# Patient Record
Sex: Female | Born: 1990 | Race: White | Hispanic: No | Marital: Married | Smoking: Never smoker
Health system: Southern US, Community
[De-identification: ages and names within clinical notes are randomized; demographics above are authoritative.]

## PROBLEM LIST (undated history)

## (undated) DIAGNOSIS — F329 Major depressive disorder, single episode, unspecified: Secondary | ICD-10-CM

## (undated) DIAGNOSIS — F32A Depression, unspecified: Secondary | ICD-10-CM

## (undated) DIAGNOSIS — F419 Anxiety disorder, unspecified: Secondary | ICD-10-CM

## (undated) HISTORY — PX: OTHER SURGICAL HISTORY: SHX169

---
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
## 2011-02-16 ENCOUNTER — Encounter: Payer: Self-pay | Admitting: Cardiovascular Disease

## 2012-02-13 ENCOUNTER — Ambulatory Visit (INDEPENDENT_AMBULATORY_CARE_PROVIDER_SITE_OTHER): Payer: 59 | Admitting: Physician Assistant

## 2012-02-13 VITALS — BP 125/77 | HR 99 | Temp 98.0°F | Resp 16 | Ht 62.0 in | Wt 99.8 lb

## 2012-02-13 DIAGNOSIS — J069 Acute upper respiratory infection, unspecified: Secondary | ICD-10-CM

## 2012-02-13 DIAGNOSIS — R05 Cough: Secondary | ICD-10-CM

## 2012-02-13 MED ORDER — IPRATROPIUM BROMIDE 0.03 % NA SOLN: 2.0000 | Freq: Two times a day (BID) | NASAL | Status: DC

## 2012-02-13 MED ORDER — HYDROCOD POLST-CHLORPHEN POLST 10-8 MG/5ML PO LQCR: 5.0000 mL | Freq: Two times a day (BID) | ORAL | Status: DC | PRN

## 2012-02-13 MED ORDER — GUAIFENESIN ER 1200 MG PO TB12: 1.0000 | ORAL_TABLET | Freq: Two times a day (BID) | ORAL | Status: DC | PRN

## 2012-02-13 NOTE — Patient Instructions (Signed)
Get lots of rest and drink at least 64 ounces of water each day.  You can expect to begin to improve on Monday and Tuesday.

## 2012-02-13 NOTE — Progress Notes (Signed)
Subjective:    Patient ID: Belinda Avery, female    DOB: Apr 01, 1991, 21 y.o.   MRN: [id number]  HPI  21 y.o. year-old WF presents with 4 days of sinus pressure, congestion, drainage and non-productive cough.  Her symptoms were preceded by nausea, vomiting and diarrhea that began on 02/07/2012.  While those symptoms are much better, and the vomiting and diarrhea have resolved, she still has very little appetite.  History reviewed. No pertinent past medical history.  Prior to Admission medications   Medication Sig Start Date End Date Taking? Authorizing Provider  levonorgestrel (MIRENA) 20 MCG/24HR IUD 1 each by Intrauterine route once.   Yes Historical Provider, MD   No Known Allergies  History   Social History  . Marital Status: Single    Spouse Name: N/A    Number of Children: 0  . Years of Education: N/A   Occupational History  . student     [employer], Investment banker, corporate   Social History Main Topics  . Smoking status: Never Smoker   . Smokeless tobacco: Never Used  . Alcohol Use: No  . Drug Use: No  . 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 Cancer Other   . Thyroid disease Other   . Diabetes Other   . Coronary artery disease Other    Review of Systems  Constitutional: Positive for appetite change. Negative for fever, chills, diaphoresis, activity change and unexpected weight change.  HENT: Positive for congestion, rhinorrhea and postnasal drip. Negative for hearing loss, ear pain, nosebleeds, sore throat, facial swelling, sneezing, drooling, mouth sores, trouble swallowing, neck pain, neck stiffness, dental problem, voice change, tinnitus and ear discharge.   Eyes: Negative.   Respiratory: Positive for cough. Negative for apnea, choking, chest tightness, shortness of breath, wheezing and stridor.   Cardiovascular: Negative.   Gastrointestinal:  Positive for nausea, vomiting and diarrhea.       Mild nausea persists, other symptoms resolved  Genitourinary: Negative.   Musculoskeletal: Negative.   Skin: Negative.   Neurological: Positive for light-headedness and headaches. Negative for dizziness, seizures, syncope, speech difficulty, weakness and numbness.  Hematological: Negative.   Psychiatric/Behavioral: Negative.        Objective:   Physical Exam  Vitals reviewed. Constitutional: She is oriented to person, place, and time. Vital signs are normal. She appears well-developed and well-nourished. She is active. No distress.  HENT:  Head: Normocephalic and atraumatic.  Right Ear: Hearing, tympanic membrane, external ear and ear canal normal.  Left Ear: Hearing, tympanic membrane, external ear and ear canal normal.  Nose: Mucosal edema and rhinorrhea present.  No foreign bodies. Right sinus exhibits no maxillary sinus tenderness and no frontal sinus tenderness. Left sinus exhibits no maxillary sinus tenderness and no frontal sinus tenderness.  Mouth/Throat: Uvula is midline, oropharynx is clear and moist and mucous membranes are normal. No uvula swelling. No oropharyngeal exudate.  Eyes: Conjunctivae, EOM and lids are normal. Pupils are equal, round, and reactive to light. Right eye exhibits no discharge. Left eye exhibits no discharge. No scleral icterus.  Fundoscopic exam:      The right eye shows no arteriolar narrowing, no AV nicking, no exudate, no hemorrhage and no papilledema. The right eye shows red reflex.The right eye shows no venous pulsations.      The left eye shows no arteriolar narrowing, no AV nicking,  no exudate, no hemorrhage and no papilledema. The left eye shows red reflex.The left eye shows no venous pulsations. Neck: Trachea normal, normal range of motion and full passive range of motion without pain. Neck supple. No mass and no thyromegaly present.  Cardiovascular: Normal rate, regular rhythm and normal heart  sounds.   Pulmonary/Chest: Effort normal and breath sounds normal.  Abdominal: Soft. Bowel sounds are normal. She exhibits no distension and no mass. There is no tenderness. There is no rebound and no guarding.  Lymphadenopathy:       Head (right side): No submandibular, no tonsillar, no preauricular, no posterior auricular and no occipital adenopathy present.       Head (left side): No submandibular, no tonsillar, no preauricular and no occipital adenopathy present.    She has no cervical adenopathy.       Right: No supraclavicular adenopathy present.       Left: No supraclavicular adenopathy present.  Neurological: She is alert and oriented to person, place, and time. She has normal strength. No cranial nerve deficit or sensory deficit.  Skin: Skin is warm, dry and intact. No rash noted.  Psychiatric: She has a normal mood and affect. Her speech is normal and behavior is normal.      Assessment & Plan:   1. Acute upper respiratory infections of unspecified site  Guaifenesin (MUCINEX MAXIMUM STRENGTH) 1200 MG TB12, ipratropium (ATROVENT) 0.03 % nasal spray  2. Cough  chlorpheniramine-HYDROcodone (TUSSIONEX PENNKINETIC ER) 10-8 MG/5ML [facility]   Patient Instructions  Get lots of rest and drink at least 64 ounces of water each day.  You can expect to begin to improve on Monday and Tuesday.

## 2013-10-24 ENCOUNTER — Encounter: Payer: Self-pay | Admitting: Neurology

## 2013-10-25 ENCOUNTER — Encounter: Payer: Self-pay | Admitting: Neurology

## 2013-10-25 ENCOUNTER — Ambulatory Visit (INDEPENDENT_AMBULATORY_CARE_PROVIDER_SITE_OTHER): Payer: 59 | Admitting: Neurology

## 2013-10-25 ENCOUNTER — Encounter (INDEPENDENT_AMBULATORY_CARE_PROVIDER_SITE_OTHER): Payer: Self-pay

## 2013-10-25 VITALS — BP 104/65 | HR 69 | Ht 62.25 in | Wt 108.0 lb

## 2013-10-25 DIAGNOSIS — R202 Paresthesia of skin: Secondary | ICD-10-CM | POA: Insufficient documentation

## 2013-10-25 DIAGNOSIS — R209 Unspecified disturbances of skin sensation: Secondary | ICD-10-CM

## 2013-10-25 MED ORDER — GABAPENTIN 100 MG PO CAPS: 100.0000 mg | ORAL_CAPSULE | Freq: Three times a day (TID) | ORAL | Status: DC

## 2013-10-25 NOTE — Patient Instructions (Signed)
Overall you are doing fairly well but I do want to suggest a few things today:   Remember to drink plenty of fluid, eat healthy meals and do not skip any meals. Try to eat protein with a every meal and eat a healthy snack such as fruit or nuts in between meals. Try to keep a regular sleep-wake schedule and try to exercise daily, particularly in the form of walking, 20-30 minutes a day, if you can.   As far as your medications are concerned, I would like to suggest the following: 1)Please start taking gabapentin 100mg  three times a day  Follow up as needed. Please call us with any interim questions, concerns, problems, updates or refill requests.   My clinical assistant and will answer any of your questions and relay your messages to me and also relay most of my messages to you.   Our phone number is [phone]. We also have an after hours call service for urgent matters and there is a physician on-call for urgent questions. For any emergencies you know to call 911 or go to the nearest emergency room

## 2013-10-25 NOTE — Progress Notes (Signed)
[facility]    Provider:  Dr Hosie PoissonSumner Referring Provider: Almon Herculesoss, Kendra H., MD Primary Care Physician:  Leanor RubensteinSUN,VYVYAN Y, MD  CC:  pain  HPI:  Belinda Avery is a 23 y.o. female here as a referral from Dr. Tenny Crawoss for evaluation of radiating pain in arm and legs and behind right ear. Patient reports symptoms began after taking macrobid in 2012. Initially noted paresthesias, described as pins and needles sensation in right fingertips, up to elbow, comes and goes. Comes randomly and self resolves. No weakness. Occurs more often during the daytime. Also notes similar symptoms in her lower extremity (back of thigh) and behind right ear. 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 Right ear symptoms are accompanied by a migraine. No symptoms on the left side, no focal motor changes. No change in vision. Has tried multiple over the counter medications.   Otherwise healthy.   Review of Systems: Out of a complete 14 system review, the patient complains of only the following symptoms, and all other reviewed systems are negative. Headaches, numbness  History   Social History  . Marital Status: Single    Spouse Name: N/A    Number of Children: 0  . Years of Education: N/A   Occupational History  . student     [employer], Investment banker, [employer]olitical Science   Social History Main Topics  . Smoking status: Never Smoker   . Smokeless tobacco: Never Used  . Alcohol Use: Yes  . Drug Use: No  . Sexual Activity: Not on file   Other Topics Concern  . 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 Cancer Other   . Thyroid disease Other   . Diabetes Other   . Coronary artery disease Other     No past medical history on file.  Past Surgical  History  Procedure Laterality Date  . Nose cauterization      Current Outpatient Prescriptions  Medication Sig Dispense Refill  . levonorgestrel (MIRENA) 20 MCG/24HR IUD 1 each by Intrauterine route once.      . valACYclovir (VALTREX) 500 MG tablet Take 500 mg by mouth 2 (two) times daily.      . ValGANciclovir HCl (VALCYTE PO) Take 1 g by mouth.       No current facility-administered medications for this visit.    Allergies as of 10/25/2013  . (No Known Allergies)    Vitals: BP 104/65  Pulse 69  Ht 5' 2.25" (1.581 m)  Wt 108 lb (48.988 kg)  BMI 19.60 kg/m2 Last Weight:  Wt Readings from Last 1 Encounters:  10/25/13 108 lb (48.988 kg)   Last Height:   Ht Readings from Last 1 Encounters:  10/25/13 5' 2.25" (1.581 m)     Physical exam: Exam: Gen: NAD, conversant Eyes: anicteric sclerae, moist conjunctivae HENT: Atraumatic, oropharynx clear Neck: Trachea midline; supple,  Lungs: CTA, no wheezing, rales, rhonic                          CV: RRR, no MRG Abdomen: Soft, non-tender;  Extremities: No peripheral edema  Skin: Normal temperature, no rash,  Psych: Appropriate affect, pleasant  Neuro: MS: AA&Ox3, appropriately interactive, normal affect   ntion: WORLD backwards  Speech: fluent w/o paraphasic error  Memory: good recent and remote recall  CN: PERRL, EOMI no nystagmus, no ptosis, sensation intact to LT V1-V3 bilat, face symmetric, no weakness, hearing grossly intact, palate elevates symmetrically, shoulder shrug 5/5 bilat,  tongue protrudes midline, no fasiculations noted.  Motor: normal bulk and tone Strength: 5/5  In all extremities  Coord: rapid alternating and point-to-point (FNF, HTS) movements intact.  Reflexes: symmetrical, bilat downgoing toes  Sens: LT intact in all extremities  Gait: posture, stance, stride and arm-swing normal. Tandem gait intact. Able to walk on heels and toes. Romberg absent.   Assessment:  After physical and  neurologic examination, review of laboratory studies, imaging, neurophysiology testing and pre-existing records, assessment will be reviewed on the problem list.  Plan:  Treatment plan and additional workup will be reviewed under Problem List.  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 Unclear etiology of symptoms, could potentially be related to HSV 2 infection or a neuropathy related to macrobid usage. Unilateral nature of symptoms raise question of a central process but rest of exam/history is not consistent with this. Will start patient on trial of gabapentin 100mg  TID, titrate up as tolerated. If no improvement would consider MRI brain to rule out central process and/or EMG, lab workup. Follow up as needed.   Elspeth Cho, DO  [facility] [address] [place], [place] [address]  Phone [phone] Fax [phone]

## 2013-10-28 ENCOUNTER — Ambulatory Visit (INDEPENDENT_AMBULATORY_CARE_PROVIDER_SITE_OTHER): Payer: 59 | Admitting: Physician Assistant

## 2013-10-28 VITALS — BP 102/64 | HR 64 | Temp 99.1°F | Resp 16 | Ht 62.0 in | Wt 107.0 lb

## 2013-10-28 DIAGNOSIS — R300 Dysuria: Secondary | ICD-10-CM

## 2013-10-28 DIAGNOSIS — N390 Urinary tract infection, site not specified: Secondary | ICD-10-CM

## 2013-10-28 DIAGNOSIS — R350 Frequency of micturition: Secondary | ICD-10-CM

## 2013-10-28 LAB — POCT URINALYSIS DIPSTICK
Bilirubin, UA: NEGATIVE
Glucose, UA: NEGATIVE
Ketones, UA: NEGATIVE
Nitrite, UA: NEGATIVE
Protein, UA: NEGATIVE
Spec Grav, UA: 1.025
Urobilinogen, UA: 0.2
pH, UA: 6

## 2013-10-28 LAB — POCT UA - MICROSCOPIC ONLY
Casts, Ur, LPF, POC: NEGATIVE
Crystals, Ur, HPF, POC: NEGATIVE
Mucus, UA: NEGATIVE
Yeast, UA: NEGATIVE

## 2013-10-28 MED ORDER — CIPROFLOXACIN HCL 250 MG PO TABS: 250.0000 mg | ORAL_TABLET | Freq: Two times a day (BID) | ORAL | Status: DC

## 2013-10-28 MED ORDER — PHENAZOPYRIDINE HCL 200 MG PO TABS: 200.0000 mg | ORAL_TABLET | Freq: Three times a day (TID) | ORAL | Status: DC | PRN

## 2013-10-28 NOTE — Patient Instructions (Signed)
Urinary Tract Infection  Urinary tract infections (UTIs) can develop anywhere along your urinary tract. Your urinary tract is your body's drainage system for removing wastes and extra water. Your urinary tract includes two kidneys, two ureters, a bladder, and a urethra. Your kidneys are a pair of bean-shaped organs. Each kidney is about the size of your fist. They are located below your ribs, one on each side of your spine.  CAUSES  Infections are caused by microbes, which are microscopic organisms, including fungi, viruses, and bacteria. These organisms are so small that they can only be seen through a microscope. Bacteria are the microbes that most commonly cause UTIs.  SYMPTOMS   Symptoms of UTIs may vary by age and gender of the patient and by the location of the infection. Symptoms in young women typically include a frequent and intense urge to urinate and a painful, burning feeling in the bladder or urethra during urination. Older women and men are more likely to be tired, shaky, and weak and have muscle aches and abdominal pain. A fever may mean the infection is in your kidneys. Other symptoms of a kidney infection include pain in your back or sides below the ribs, nausea, and vomiting.  DIAGNOSIS  To diagnose a UTI, your caregiver will ask you about your symptoms. Your caregiver also will ask to provide a urine sample. The urine sample will be tested for bacteria and white blood cells. White blood cells are made by your body to help fight infection.  TREATMENT   Typically, UTIs can be treated with medication. Because most UTIs are caused by a bacterial infection, they usually can be treated with the use of antibiotics. The choice of antibiotic and length of treatment depend on your symptoms and the type of bacteria causing your infection.  HOME CARE INSTRUCTIONS   If you were prescribed antibiotics, take them exactly as your caregiver instructs you. Finish the medication even if you feel better after you  have only taken some of the medication.   Drink enough water and fluids to keep your urine clear or pale yellow.   Avoid caffeine, tea, and carbonated beverages. They tend to irritate your bladder.   Empty your bladder often. Avoid holding urine for long periods of time.   Empty your bladder before and after sexual intercourse.   After a bowel movement, women should cleanse from front to back. Use each tissue only once.  SEEK MEDICAL CARE IF:    You have back pain.   You develop a fever.   Your symptoms do not begin to resolve within 3 days.  SEEK IMMEDIATE MEDICAL CARE IF:    You have severe back pain or lower abdominal pain.   You develop chills.   You have nausea or vomiting.   You have continued burning or discomfort with urination.  MAKE SURE YOU:    Understand these instructions.   Will watch your condition.   Will get help right away if you are not doing well or get worse.  Document Released: 04/29/2005 Document Revised: 01/19/2012 Document Reviewed: 08/28/2011  Exit[employer] Patient Information 2014 [employer]

## 2013-10-29 NOTE — Progress Notes (Signed)
Patient ID: Belinda Avery MRN: [id number], DOB: 1991-06-05, 23 y.o. Date of Encounter: 10/29/2013, 7:52 AM  Primary Physician: Leanor RubensteinSUN,VYVYAN Y, MD  Chief Complaint: urinary frequency and dysuria  HPI: 23 y.o. year old female with presents with 1 day history of urinary frequency, dysuria, and suprapubic pressure. Denies flank pain, nausea, vomiting, fever, chills, vaginal discharge, or odor. Has not tried anything OTC. LNMP unknown due to IUD.  Patient is otherwise doing well without issues or complaints.  History reviewed. No pertinent past medical history.   Home Meds: Prior to Admission medications   Medication Sig Start Date End Date Taking? Authorizing Provider  gabapentin (NEURONTIN) 100 MG capsule Take 1 capsule (100 mg total) by mouth 3 (three) times daily. 10/25/13  Yes Omelia BlackwaterPeter Justin Sumner, DO  levonorgestrel (MIRENA) 20 MCG/24HR IUD 1 each by Intrauterine route once.   Yes Historical Provider, MD  valACYclovir (VALTREX) 500 MG tablet Take 500 mg by mouth 2 (two) times daily.   Yes Historical Provider, MD  ValGANciclovir HCl (VALCYTE PO) Take 1 g by mouth.   Yes Historical Provider, MD  ciprofloxacin (CIPRO) 250 MG tablet Take 1 tablet (250 mg total) by mouth 2 (two) times daily. 10/28/13   Nelva NayHeather M Kuulei Kleier, PA-C  phenazopyridine (PYRIDIUM) 200 MG tablet Take 1 tablet (200 mg total) by mouth 3 (three) times daily as needed for pain. 10/28/13   Nelva NayHeather M Jeanenne Licea, PA-C    Allergies:  Allergies  Allergen Reactions  . Macrolides And Ketolides     History   Social History  . Marital Status: Single    Spouse Name: N/A    Number of Children: 0  . Years of Education: N/A   Occupational History  . student     [employer], Investment banker, [employer]   Social History Main Topics  . Smoking status: Never Smoker   . Smokeless tobacco: Never Used  . Alcohol Use: Yes  . Drug Use: No  . Sexual Activity: Not on file   Other Topics Concern  . Not on file   Social History Narrative   Student.  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 Single    No children   Right handed   Working toward [employer]achelor's degree   1-2 cups daily     Review of Systems: Constitutional: negative for chills, fever, night sweats, weight changes, or fatigue  HEENT: negative for vision changes, hearing loss, congestion, rhinorrhea, ST, epistaxis, or sinus pressure Cardiovascular: negative for chest pain or palpitations Respiratory: negative for cough, hemoptysis, wheezing, shortness of breath. Abdominal: positive for suprapubic abdominal pain,   No nausea, vomiting, diarrhea, or constipation Genitourinary: positive for urinary frequency and dysuria. Negative for vaginal discharge, odor, or pelvic pain.  Dermatological: negative for rashes. Neurologic: negative for headache, dizziness, or syncope   Physical Exam: Blood pressure 102/64, pulse 64, temperature 99.1 F (37.3 C), resp. rate 16, height 5\' 2"  (1.575 m), weight 107 lb (48.535 kg), SpO2 100.00%., Body mass index is 19.57 kg/(m^2). General: Well developed, well nourished, in no acute distress. Head: Normocephalic, atraumatic, eyes without discharge, sclera non-icteric, nares are without discharge. External ear normal in appearance. Neck: Supple. No thyromegaly. Full ROM. No lymphadenopathy. Lungs: Clear bilaterally to auscultation without wheezes, rales, or rhonchi. Breathing is unlabored. Heart: RRR with S1 S2. No murmurs, rubs, or gallops appreciated. Abdominal: +BS x 4. No hepatosplenomegaly, rebound tenderness, or guarding. Positive suprapubic tenderness. No CVA tenderness bilaterally.  Msk:  Strength and tone normal for age. Extremities/Skin: Warm and dry.  No clubbing or cyanosis. No edema.  Neuro: Alert and oriented X 3. Moves all extremities spontaneously. Gait is normal. CNII-XII grossly in tact. Psych:  Responds to questions appropriately with a normal affect.   Labs: Results for orders placed in visit on 10/28/13  POCT UA - MICROSCOPIC  ONLY      Result Value Ref Range   WBC, Ur, HPF, POC 0-5     RBC, urine, microscopic 6-22     Bacteria, U Microscopic 1+     Mucus, UA neg     Epithelial cells, urine per micros 6-18     Crystals, Ur, HPF, POC neg     Casts, Ur, LPF, POC neg     Yeast, UA neg    POCT URINALYSIS DIPSTICK      Result Value Ref Range   Color, UA bright yellow     Clarity, UA cloudy     Glucose, UA neg     Bilirubin, UA neg     Ketones, UA neg     Spec Grav, UA 1.025     Blood, UA mod     pH, UA 6.0     Protein, UA neg     Urobilinogen, UA 0.2     Nitrite, UA neg     Leukocytes, UA small (1+)       ASSESSMENT AND PLAN:  23 y.o. year old female with urinary tract infection Urine culture sent Increase fluids Cipro 250 mg bid x 5 days Pyridium 200 mg tid as needed for symptomatic relief Follow up if symptoms worsen or fail to improve.  Grier Mitts, PA-C 10/29/2013 7:52 AM

## 2013-10-30 LAB — URINE CULTURE: Colony Count: 25000

## 2014-01-10 ENCOUNTER — Ambulatory Visit
Admission: RE | Admit: 2014-01-10 | Discharge: 2014-01-10 | Disposition: A | Discharge status: Home / Self Care | Payer: 59 | Source: Ambulatory Visit | Attending: Obstetrics and Gynecology | Admitting: Obstetrics and Gynecology

## 2014-01-10 ENCOUNTER — Other Ambulatory Visit: Payer: Self-pay | Admitting: Obstetrics and Gynecology

## 2014-01-10 DIAGNOSIS — R109 Unspecified abdominal pain: Secondary | ICD-10-CM

## 2014-04-24 ENCOUNTER — Encounter (HOSPITAL_COMMUNITY): Payer: Self-pay

## 2014-04-24 ENCOUNTER — Inpatient Hospital Stay (HOSPITAL_COMMUNITY): Payer: 59

## 2014-04-24 ENCOUNTER — Inpatient Hospital Stay (HOSPITAL_COMMUNITY)
Admission: AD | Admit: 2014-04-24 | Discharge: 2014-04-24 | Disposition: A | Discharge status: Home / Self Care | Payer: 59 | Source: Ambulatory Visit | Attending: Obstetrics and Gynecology | Admitting: Obstetrics and Gynecology

## 2014-04-24 DIAGNOSIS — N9489 Other specified conditions associated with female genital organs and menstrual cycle: Secondary | ICD-10-CM | POA: Diagnosis not present

## 2014-04-24 DIAGNOSIS — O209 Hemorrhage in early pregnancy, unspecified: Secondary | ICD-10-CM | POA: Insufficient documentation

## 2014-04-24 DIAGNOSIS — N938 Other specified abnormal uterine and vaginal bleeding: Secondary | ICD-10-CM | POA: Diagnosis present

## 2014-04-24 DIAGNOSIS — O2631 Retained intrauterine contraceptive device in pregnancy, first trimester: Secondary | ICD-10-CM

## 2014-04-24 DIAGNOSIS — Z3201 Encounter for pregnancy test, result positive: Secondary | ICD-10-CM | POA: Diagnosis not present

## 2014-04-24 DIAGNOSIS — Z975 Presence of (intrauterine) contraceptive device: Secondary | ICD-10-CM | POA: Diagnosis not present

## 2014-04-24 DIAGNOSIS — N949 Unspecified condition associated with female genital organs and menstrual cycle: Secondary | ICD-10-CM | POA: Diagnosis present

## 2014-04-24 LAB — CBC WITH DIFFERENTIAL/PLATELET
BASOS PCT: 0 % (ref 0–1)
Basophils Absolute: 0 10*3/uL (ref 0.0–0.1)
EOS ABS: 0.2 10*3/uL (ref 0.0–0.7)
EOS PCT: 2 % (ref 0–5)
HEMATOCRIT: 41.6 % (ref 36.0–46.0)
HEMOGLOBIN: 14.1 g/dL (ref 12.0–15.0)
LYMPHS ABS: 2.5 10*3/uL (ref 0.7–4.0)
Lymphocytes Relative: 27 % (ref 12–46)
MCH: 31.2 pg (ref 26.0–34.0)
MCHC: 33.9 g/dL (ref 30.0–36.0)
MCV: 92 fL (ref 78.0–100.0)
MONO ABS: 0.8 10*3/uL (ref 0.1–1.0)
MONOS PCT: 9 % (ref 3–12)
NEUTROS PCT: 62 % (ref 43–77)
Neutro Abs: 5.9 10*3/uL (ref 1.7–7.7)
Platelets: 264 10*3/uL (ref 150–400)
RBC: 4.52 MIL/uL (ref 3.87–5.11)
RDW: 12.2 % (ref 11.5–15.5)
WBC: 9.4 10*3/uL (ref 4.0–10.5)

## 2014-04-24 LAB — POCT PREGNANCY, URINE: Preg Test, Ur: NEGATIVE

## 2014-04-24 LAB — URINALYSIS, ROUTINE W REFLEX MICROSCOPIC
BILIRUBIN URINE: NEGATIVE
Glucose, UA: NEGATIVE mg/dL
Ketones, ur: NEGATIVE mg/dL
Leukocytes, UA: NEGATIVE
NITRITE: NEGATIVE
PROTEIN: NEGATIVE mg/dL
SPECIFIC GRAVITY, URINE: 1.01 (ref 1.005–1.030)
UROBILINOGEN UA: 0.2 mg/dL (ref 0.0–1.0)
pH: 6.5 (ref 5.0–8.0)

## 2014-04-24 LAB — URINE MICROSCOPIC-ADD ON

## 2014-04-24 LAB — HCG, QUANTITATIVE, PREGNANCY: HCG, BETA CHAIN, QUANT, S: 89 m[IU]/mL — AB (ref <5)

## 2014-04-24 LAB — WET PREP, GENITAL
Trich, Wet Prep: NONE SEEN
Yeast Wet Prep HPF POC: NONE SEEN

## 2014-04-24 NOTE — MAU Provider Note (Signed)
History     CSN: [id number]  Arrival date and time: 04/24/14 1941   First Provider Initiated Contact with Patient 04/24/14 2130      Chief Complaint  Patient presents with  . Possible Pregnancy  . Vaginal Bleeding  . Abdominal Pain   HPI [name] is a 23 y.o. G1 at Unknown who presents to MAU today with complaint of vaginal bleeding and lower abdominal pain. The patient states that she passed a large clot and had heavy bleeding on Friday. She has noted decrease to spotting since late Friday that continues today. She states off and on lower abdominal pain rated at 7/10 now. She states +HPT today. She had Mirena IUD placed 3 years ago. She has had some nausea without vomiting, diarrhea, or constipation. She also endorses dizziness, but denies fever, weakness, fatigue or LOC.   OB History   Grav Para Term Preterm Abortions TAB SAB Ect Mult Living                  History reviewed. No pertinent past medical history.  Past Surgical History  Procedure Laterality Date  . Nose cauterization      Family History  Problem Relation Age of Onset  . Cancer Other   . Thyroid disease Other   . Diabetes Other   . Coronary artery disease Other     History  Substance Use Topics  . Smoking status: Never Smoker   . Smokeless tobacco: Never Used  . Alcohol Use: Yes    Allergies:  Allergies  Allergen Reactions  . Macrolides And Ketolides     No prescriptions prior to admission    Review of Systems  Constitutional: Negative for fever and malaise/fatigue.  Gastrointestinal: Positive for nausea and abdominal pain. Negative for vomiting.  Genitourinary:       + vaginal bleeding  Neurological: Positive for dizziness. Negative for loss of consciousness and weakness.   Physical Exam   Blood pressure 128/74, pulse 86, temperature 99.3 F (37.4 C), temperature source Oral, resp. rate 18, height  (1.575 m), weight 109 lb (49.442 kg), last menstrual period  04/24/2014, SpO2 100.00%.  Physical Exam  Constitutional: She is oriented to person, place, and time. She appears well-developed and well-nourished. No distress.  HENT:  Head: Normocephalic.  Cardiovascular: Normal rate.   Respiratory: Effort normal.  GI: Soft. She exhibits no distension and no mass. There is tenderness (mild tenderness to palpation of the LLQ). There is no rebound and no guarding.  Genitourinary: Uterus is tender (mild). Uterus is not enlarged. Cervix exhibits no motion tenderness, no discharge and no friability. Right adnexum displays no mass and no tenderness. Left adnexum displays no mass and no tenderness. No bleeding around the vagina. No vaginal discharge found.    Neurological: She is alert and oriented to person, place, and time.  Skin: Skin is warm and dry. No erythema.  Psychiatric: She has a normal mood and affect.  Cervix: closed, thick  Results for orders placed during the hospital encounter of 04/24/14 (from the past 24 hour(s))  URINALYSIS, ROUTINE W REFLEX MICROSCOPIC     Status: Abnormal   Collection Time    04/24/14  7:54 PM      Result Value Ref Range   Color, Urine YELLOW  YELLOW   APPearance CLEAR  CLEAR   Specific Gravity, Urine 1.010  1.005 - 1.030   pH 6.5  5.0 - 8.0   Glucose, UA NEGATIVE  NEGATIVE mg/dL  Hgb urine dipstick LARGE (*) NEGATIVE   Bilirubin Urine NEGATIVE  NEGATIVE   Ketones, ur NEGATIVE  NEGATIVE mg/dL   Protein, ur NEGATIVE  NEGATIVE mg/dL   Urobilinogen, UA 0.2  0.0 - 1.0 mg/dL   Nitrite NEGATIVE  NEGATIVE   Leukocytes, UA NEGATIVE  NEGATIVE  URINE MICROSCOPIC-ADD ON     Status: Abnormal   Collection Time    04/24/14  7:54 PM      Result Value Ref Range   Squamous Epithelial / LPF FEW (*) RARE   WBC, UA 3-6  <3 WBC/hpf   RBC / HPF 11-20  <3 RBC/hpf   Bacteria, UA FEW (*) RARE  POCT PREGNANCY, URINE     Status: None   Collection Time    04/24/14  8:27 PM      Result Value Ref Range   Preg Test, Ur NEGATIVE   NEGATIVE  HCG, QUANTITATIVE, PREGNANCY     Status: Abnormal   Collection Time    04/24/14  8:57 PM      Result Value Ref Range   hCG, Beta Chain, Quant, S 89 (*) <5 mIU/mL  ABO/RH     Status: None   Collection Time    04/24/14  8:57 PM      Result Value Ref Range   ABO/RH(D) O POS    CBC WITH DIFFERENTIAL     Status: None   Collection Time    04/24/14  8:57 PM      Result Value Ref Range   WBC 9.4  4.0 - 10.5 K/uL   RBC 4.52  3.87 - 5.11 MIL/uL   Hemoglobin 14.1  12.0 - 15.0 g/dL   HCT 16.1  09.6 - 04.5 %   MCV 92.0  78.0 - 100.0 fL   MCH 31.2  26.0 - 34.0 pg   MCHC 33.9  30.0 - 36.0 g/dL   RDW 40.9  81.1 - 91.4 %   Platelets 264  150 - 400 K/uL   Neutrophils Relative % 62  43 - 77 %   Neutro Abs 5.9  1.7 - 7.7 K/uL   Lymphocytes Relative 27  12 - 46 %   Lymphs Abs 2.5  0.7 - 4.0 K/uL   Monocytes Relative 9  3 - 12 %   Monocytes Absolute 0.8  0.1 - 1.0 K/uL   Eosinophils Relative 2  0 - 5 %   Eosinophils Absolute 0.2  0.0 - 0.7 K/uL   Basophils Relative 0  0 - 1 %   Basophils Absolute 0.0  0.0 - 0.1 K/uL  WET PREP, GENITAL     Status: Abnormal   Collection Time    04/24/14  9:40 PM      Result Value Ref Range   Yeast Wet Prep HPF POC NONE SEEN  NONE SEEN   Trich, Wet Prep NONE SEEN  NONE SEEN   Clue Cells Wet Prep HPF POC FEW (*) NONE SEEN   WBC, Wet Prep HPF POC FEW (*) NONE SEEN   US Ob Comp Less 14 Wks  04/24/2014   CLINICAL DATA:  Vaginal bleeding, quantitative beta HCG of 89, questioned presence of intrauterine device  EXAM: OBSTETRIC <14 WK [place] AND TRANSVAGINAL OB US  TECHNIQUE: Both transabdominal and transvaginal ultrasound examinations were performed for complete evaluation of the gestation as well as the maternal uterus, adnexal regions, and pelvic cul-de-sac. Transvaginal technique was performed to assess early pregnancy.  COMPARISON:  None.  FINDINGS: No intrauterine gestation identified. There  is an intrauterine device seen in anticipated position. Both ovaries  are normal. There is no free fluid.  There is a complex cystic structure in the lower uterine segment, measuring 19 x 19 x 17 mm, demonstrating low level vascularity. It demonstrates multiple internal septations. This appears to arise in the midline posteriorly, the level of the junction of the endometrium and myometrium.  IMPRESSION: There is no sonographic evidence of intrauterine just patient. There is a complex cystic structure in the low uterine segment. It could represent a complex endometrial polyp or focus of endometrial carcinoma. Complications of endometritis could also be considered. A subendometrial atypical fibroid is another consideration. Consider close follow-up of quantitative beta HCG and when appropriate, consider pelvic MRI to further characterize this finding.   Electronically Signed   By: Esperanza Heir M.D.   On: 04/24/2014 22:40   US Ob Transvaginal  04/24/2014   CLINICAL DATA:  Vaginal bleeding, quantitative beta HCG of 89, questioned presence of intrauterine device  EXAM: OBSTETRIC <14 WK [place] AND TRANSVAGINAL OB US  TECHNIQUE: Both transabdominal and transvaginal ultrasound examinations were performed for complete evaluation of the gestation as well as the maternal uterus, adnexal regions, and pelvic cul-de-sac. Transvaginal technique was performed to assess early pregnancy.  COMPARISON:  None.  FINDINGS: No intrauterine gestation identified. There is an intrauterine device seen in anticipated position. Both ovaries are normal. There is no free fluid.  There is a complex cystic structure in the lower uterine segment, measuring 19 x 19 x 17 mm, demonstrating low level vascularity. It demonstrates multiple internal septations. This appears to arise in the midline posteriorly, the level of the junction of the endometrium and myometrium.  IMPRESSION: There is no sonographic evidence of intrauterine just patient. There is a complex cystic structure in the low uterine segment. It could  represent a complex endometrial polyp or focus of endometrial carcinoma. Complications of endometritis could also be considered. A subendometrial atypical fibroid is another consideration. Consider close follow-up of quantitative beta HCG and when appropriate, consider pelvic MRI to further characterize this finding.   Electronically Signed   By: Esperanza Heir M.D.   On: 04/24/2014 22:40    MAU Course  Procedures None  MDM UPT - negative UA, wet prep, GC/Chlamydia, CBC, ABO/RH, quant hCG and [place] today Discussed with Dr. Claiborne Billings. Follow-up in MAU in 48 hours and call the office for follow-up with Waynard Reeds  Assessment and Plan  A: Positive pregnancy test Vaginal bleeding in pregnancy IUD in place Complex cystic structure in the lower uterine segment  P: Discharge home Bleeding/Ectopic precautions discussed Patient to return to MAU in 48 hours for follow-up labs or sooner if symptoms worsen Patient advised to call [facility] tomorrow for a follow-up appointment  Marny Lowenstein, PA-C  04/25/2014, 12:34 AM

## 2014-04-24 NOTE — MAU Note (Signed)
Pt reports lower abd pain 4 days ago and passed a really large clot, still having some pain.continues to have vaginal spotting.

## 2014-04-24 NOTE — Discharge Instructions (Signed)
Pelvic Rest °Pelvic rest is sometimes recommended for women when:  °· The placenta is partially or completely covering the opening of the cervix (placenta previa). °· There is bleeding between the uterine wall and the amniotic sac in the first trimester (subchorionic hemorrhage). °· The cervix begins to open without labor starting (incompetent cervix, cervical insufficiency). °· The labor is too early (preterm labor). °HOME CARE INSTRUCTIONS °· Do not have sexual intercourse, stimulation, or an orgasm. °· Do not use tampons, douche, or put anything in the vagina. °· Do not lift anything over 10 pounds (4.5 kg). °· Avoid strenuous activity or straining your pelvic muscles. °SEEK MEDICAL CARE IF:  °· You have any vaginal bleeding during pregnancy. Treat this as a potential emergency. °· You have cramping pain felt low in the stomach (stronger than menstrual cramps). °· You notice vaginal discharge (watery, mucus, or bloody). °· You have a low, dull backache. °· There are regular contractions or uterine tightening. °SEEK IMMEDIATE MEDICAL CARE IF: °You have vaginal bleeding and have placenta previa.  °Document Released: 11/14/2010 Document Revised: 10/12/2011 Document Reviewed: 11/14/2010 °ExitCare® Patient Information ©2015 [employer]. This information is not intended to replace advice given to you by your health care provider. Make sure you discuss any questions you have with your health care provider. ° °

## 2014-04-25 ENCOUNTER — Encounter (HOSPITAL_COMMUNITY): Payer: Self-pay | Admitting: Medical

## 2014-04-25 LAB — ABO/RH: ABO/RH(D): O POS

## 2014-04-25 LAB — GC/CHLAMYDIA PROBE AMP
CT PROBE, AMP APTIMA: NEGATIVE
GC PROBE AMP APTIMA: NEGATIVE

## 2014-05-04 ENCOUNTER — Observation Stay (HOSPITAL_COMMUNITY)
Admission: AD | Admit: 2014-05-04 | Discharge: 2014-05-05 | Disposition: A | Discharge status: Home / Self Care | Payer: 59 | Source: Ambulatory Visit | Attending: Obstetrics | Admitting: Obstetrics

## 2014-05-04 ENCOUNTER — Encounter (HOSPITAL_COMMUNITY): Payer: Self-pay | Admitting: *Deleted

## 2014-05-04 DIAGNOSIS — O3680X0 Pregnancy with inconclusive fetal viability, not applicable or unspecified: Secondary | ICD-10-CM

## 2014-05-04 DIAGNOSIS — R109 Unspecified abdominal pain: Secondary | ICD-10-CM | POA: Insufficient documentation

## 2014-05-04 DIAGNOSIS — O26899 Other specified pregnancy related conditions, unspecified trimester: Principal | ICD-10-CM | POA: Insufficient documentation

## 2014-05-04 DIAGNOSIS — Z3A00 Weeks of gestation of pregnancy not specified: Secondary | ICD-10-CM | POA: Insufficient documentation

## 2014-05-04 LAB — CBC
HCT: 39.3 % (ref 36.0–46.0)
HEMATOCRIT: 37.5 % (ref 36.0–46.0)
Hemoglobin: 13.1 g/dL (ref 12.0–15.0)
Hemoglobin: 12.6 g/dL (ref 12.0–15.0)
MCH: 30.5 pg (ref 26.0–34.0)
MCH: 30.6 pg (ref 26.0–34.0)
MCHC: 33.3 g/dL (ref 30.0–36.0)
MCHC: 33.6 g/dL (ref 30.0–36.0)
MCV: 91.4 fL (ref 78.0–100.0)
MCV: 91 fL (ref 78.0–100.0)
Platelets: 225 10*3/uL (ref 150–400)
Platelets: 214 10*3/uL (ref 150–400)
RBC: 4.3 MIL/uL (ref 3.87–5.11)
RBC: 4.12 MIL/uL (ref 3.87–5.11)
RDW: 11.9 % (ref 11.5–15.5)
RDW: 11.9 % (ref 11.5–15.5)
WBC: 6.5 10*3/uL (ref 4.0–10.5)
WBC: 7.3 10*3/uL (ref 4.0–10.5)

## 2014-05-04 MED ORDER — ONDANSETRON HCL 4 MG/2ML IJ SOLN: 4.0000 mg | Freq: Four times a day (QID) | INTRAMUSCULAR | Status: DC | PRN

## 2014-05-04 MED ORDER — SODIUM CHLORIDE 0.9 % IJ SOLN: 3.0000 mL | INTRAMUSCULAR | Status: DC | PRN

## 2014-05-04 MED ORDER — DIPHENHYDRAMINE HCL 12.5 MG/5ML PO ELIX: 12.5000 mg | ORAL_SOLUTION | Freq: Four times a day (QID) | ORAL | Status: DC | PRN

## 2014-05-04 MED ORDER — DIPHENHYDRAMINE HCL 50 MG/ML IJ SOLN: 12.5000 mg | Freq: Four times a day (QID) | INTRAMUSCULAR | Status: DC | PRN

## 2014-05-04 MED ORDER — HYDROMORPHONE 0.3 MG/ML IV SOLN
INTRAVENOUS | Status: DC
  Administered 2014-05-04: 0.6 mg via INTRAVENOUS
  Administered 2014-05-04: 19:00:00 via INTRAVENOUS
  Administered 2014-05-05: 0.9 mg via INTRAVENOUS
  Filled 2014-05-04: qty 25

## 2014-05-04 MED ORDER — SODIUM CHLORIDE 0.9 % IJ SOLN: 3.0000 mL | Freq: Two times a day (BID) | INTRAMUSCULAR | Status: DC

## 2014-05-04 MED ORDER — NALOXONE HCL 0.4 MG/ML IJ SOLN: 0.4000 mg | INTRAMUSCULAR | Status: DC | PRN

## 2014-05-04 MED ORDER — LACTATED RINGERS IV SOLN
INTRAVENOUS | Status: DC
  Administered 2014-05-04: 23:00:00 via INTRAVENOUS

## 2014-05-04 MED ORDER — SODIUM CHLORIDE 0.9 % IJ SOLN: 9.0000 mL | INTRAMUSCULAR | Status: DC | PRN

## 2014-05-04 MED ORDER — SODIUM CHLORIDE 0.9 % IV SOLN: 250.0000 mL | INTRAVENOUS | Status: DC | PRN

## 2014-05-04 NOTE — H&P (Signed)
23 y.o. G1P0 with pregnancy of unknown location presents for observation.  Had a +HPT on 9/22 with an IUD in situ.  Ultrasound on 9/22 showed a 19 x 19 x 17 mm complex cystic structure in the lower uterine segment.  Beta hcg has risen inappropriately and follow up ultrasounds have not identified an intrauterine gestation.   On 9/28, she was given methotrexate for presumed ectopic pregnancy.     Beta hcg trend: 9/22: 89 9/24: 93.9 9/28 (day 0) 95.6 10/2(day 4) 123.2  She presented to clinic for day 4 labs with complaints of increased pelvic pain not relieved with percocet or motrin.  No bleeding.  F/u US in clinic today: no free fluid, unchanged appearance of endometrial lining. IUD remains in situ.  Currently, she reports minimal pain.   Much improved from clinic.  No nausea, vomiting or vaginal bleeding.   Has used PCA 3x since arrival 5 hrs ago.    History reviewed. No pertinent past medical history.  Past Surgical History  Procedure Laterality Date  . Nose cauterization      OB History  Gravida Para Term Preterm AB SAB TAB Ectopic Multiple Living  1             # Outcome Date GA Lbr Len/2nd Weight Sex Delivery Anes PTL Lv  1 CUR                 BP 105/64  Pulse 69  Temp(Src) 98.7 F (37.1 C) (Oral)  Resp 16  Ht 5\' 2"  (1.575 m)  Wt 48.444 kg (106 lb 12.8 oz)  BMI 19.53 kg/m2  SpO2 97%  LMP 04/24/2014   General:  NAD Lungs: no increased work of breathing Cardiac: RRR Abdomen:  + BS, soft, non-distended.  No ttp in rlq/llq/  No rebound or guarding Ex: no edema  Lab Results  Component Value Date   WBC 7.3 05/04/2014   HGB 12.6 05/04/2014   HCT 37.5 05/04/2014   MCV 91.0 05/04/2014   PLT 214 05/04/2014     A/P   23 y.o. G1P0 presents for obsesrvation in the setting of pregnancy of unknown location. Desired pregnancy.  Plan of care discussed with Dr. Tenny Crawoss, who desires observation, serial abdominal exams, serial cbc, and dilaudid PCA for pain control Hgb on admission  13.1.  Repeat cbc stable at 12.6.  Vital signs remain stable.    Will keep NPO.  Serial abdominal exams overnight.    Repeat cbc in AM.  If hgb stable and exam remains benign, d/c home.  Dx laparoscopy for unstable vital signs, peritoneal signs on abdominal exam or falling hgb.    [place], [facility] Hospital[facility]ANNA

## 2014-05-05 LAB — CBC
HEMATOCRIT: 37.6 % (ref 36.0–46.0)
Hemoglobin: 12.5 g/dL (ref 12.0–15.0)
MCH: 30.5 pg (ref 26.0–34.0)
MCHC: 33.2 g/dL (ref 30.0–36.0)
MCV: 91.7 fL (ref 78.0–100.0)
Platelets: 206 10*3/uL (ref 150–400)
RBC: 4.1 MIL/uL (ref 3.87–5.11)
RDW: 12 % (ref 11.5–15.5)
WBC: 6.3 10*3/uL (ref 4.0–10.5)

## 2014-05-05 MED ORDER — IBUPROFEN 800 MG PO TABS: 800.0000 mg | ORAL_TABLET | Freq: Three times a day (TID) | ORAL | Status: DC

## 2014-05-05 MED ORDER — OXYCODONE-ACETAMINOPHEN 5-325 MG PO TABS: 1.0000 | ORAL_TABLET | Freq: Four times a day (QID) | ORAL | Status: DC | PRN

## 2014-05-05 NOTE — Progress Notes (Signed)
Doing well this AM.  Has not used PCA since 0200.  T No nausea/vomiting/vaginal bleeding. No pain this AM.    BP 93/49  Pulse 67  Temp(Src) 98.7 F (37.1 C) (Oral)  Resp 12  Ht 5\' 2"  (1.575 m)  Wt 48.444 kg (106 lb 12.8 oz)  BMI 19.53 kg/m2  SpO2 98%  LMP 04/24/2014  Lab Results  Component Value Date   WBC 6.3 05/05/2014   HGB 12.5 05/05/2014   HCT 37.6 05/05/2014   MCV 91.7 05/05/2014   PLT 206 05/05/2014    A&P: 23yo G1 w ectopic pregnancy of unknown location, s/p methotrexate, admitted for observation for pelvic pain.  Hgb stable this AM at 12.5 She is asymptomatic and no longer requiring IV pain meds. Will d/c PCA, transition to PO and ambulate. If pain controlled, will d/c to home.

## 2014-05-05 NOTE — Discharge Summary (Signed)
Physician Discharge Summary  Patient ID: Belinda Avery MRN: [id number] DOB/AGE: 09-Apr-1991 23 y.o.  Admit date: 05/04/2014 Discharge date: 05/05/2014  Admission Diagnoses:  Pelvic pain Ectopic pregnancy of unknown location  Discharge Diagnoses:  Active Problems:   Pregnancy, location unknown   Discharged Condition: stable  Hospital Course: Pt admitted for observation with serial labs, serial abdominal exam and pain control. TVUS on day of admission did not show any free fluid in the pelvis.  Hemoglobin remained stable over three cbcs.   Minimal IV pain medication was required. On HD#2, she was transitioned to PO pain meds.  She had no pain and a benign abdominal exam. Was deemed stable for discharge to home.  Ectopic precautions given.   Consults: None   Discharge Exam: Blood pressure 93/49, pulse 67, temperature 98.7 F (37.1 C), temperature source Oral, resp. rate 12, height 5\' 2"  (1.575 m), weight 48.444 kg (106 lb 12.8 oz), last menstrual period 04/24/2014, SpO2 98.00%. General appearance: alert, cooperative and no distress GI: normal findings: bowel sounds normal and soft, non-tender no rebound, no guarding  Disposition: 01-Home or Self Care        Follow-up Information   Follow up with Almon HerculesOSS,KENDRA H., MD On 05/07/2014. (Repeat labs and visit)    Specialty:  Obstetrics and Gynecology   Contact information:   [address] [place] [id number] [phone]       Signed: Marlow BaarsCLARK, Zniya Cottone 05/05/2014, 9:35 AM

## 2014-05-05 NOTE — Discharge Instructions (Signed)
Methotrexate Treatment for an Ectopic Pregnancy °Methotrexate is a medicine that treats ectopic pregnancy by stopping the growth of the fertilized egg. It also helps your body absorb tissue from the egg. This takes between 2 weeks and 6 weeks. Most ectopic pregnancies can be successfully treated with methotrexate if they are detected early enough. °LET YOUR HEALTH CARE PROVIDER KNOW ABOUT: °· Any allergies you have. °· All medicines you are taking, including vitamins, herbs, eye drops, creams, and over-the-counter medicines. °· Medical conditions you have. °RISKS AND COMPLICATIONS °Generally, this is a safe treatment. However, as with any treatment, problems can occur. Possible problems or side effects include: °· Nausea. °· Vomiting. °· Diarrhea. °· Abdominal cramping. °· Mouth sores. °· Increased vaginal bleeding or spotting.   °· Swelling or irritation of the lining of your lungs (pneumonitis).  °· Failed treatment and continuation of the pregnancy.   °· Liver damage. °· Hair loss. °There is still a risk of the ectopic pregnancy rupturing while using the methotrexate. °BEFORE THE PROCEDURE °Before you take the medicine:  °· Liver tests, kidney tests, and a complete blood test are performed. °· Blood tests are performed to measure the pregnancy hormone levels and to determine your blood type. °· If you are Rh-negative and the father is Rh-positive or his Rh type is not known, you will be given a Rho (D) immune globulin shot. °PROCEDURE  °There are two methods that your health care provider may use to prescribe methotrexate. One method involves a single dose or injection of the medicine. Another method involves a series of doses given through several injections.  °AFTER THE PROCEDURE °· You may have some abdominal cramping, vaginal bleeding, and fatigue in the first few days after taking methotrexate. °· Blood tests will be taken for several weeks to check the pregnancy hormone levels. The blood tests are performed  until there is no more pregnancy hormone detected in the blood. °Document Released: 07/14/2001 Document Revised: 12/04/2013 Document Reviewed: 05/08/2013 °ExitCare® Patient Information ©2015 [employer]. This information is not intended to replace advice given to you by your health care provider. Make sure you discuss any questions you have with your health care provider. ° °

## 2014-05-05 NOTE — Progress Notes (Signed)
Discharge information reviewed with patient and her mother.  Both state understanding of home care, activity, medications, signs/symptoms to report to MD and return MD office visit.  Patients mother will assist with her care @ home and no home equipment needed.  Patient ambulated for discharge in stable condition without incident.

## 2014-05-10 ENCOUNTER — Encounter (HOSPITAL_COMMUNITY): Payer: Self-pay | Admitting: *Deleted

## 2014-05-10 ENCOUNTER — Inpatient Hospital Stay (HOSPITAL_COMMUNITY)
Admission: AD | Admit: 2014-05-10 | Discharge: 2014-05-10 | Disposition: A | Discharge status: Home / Self Care | Payer: 59 | Source: Ambulatory Visit | Attending: Obstetrics and Gynecology | Admitting: Obstetrics and Gynecology

## 2014-05-10 DIAGNOSIS — O001 Tubal pregnancy: Secondary | ICD-10-CM | POA: Insufficient documentation

## 2014-05-10 DIAGNOSIS — N949 Unspecified condition associated with female genital organs and menstrual cycle: Secondary | ICD-10-CM | POA: Insufficient documentation

## 2014-05-10 DIAGNOSIS — R102 Pelvic and perineal pain: Secondary | ICD-10-CM

## 2014-05-10 LAB — CBC
HCT: 37.7 % (ref 36.0–46.0)
Hemoglobin: 12.8 g/dL (ref 12.0–15.0)
MCH: 31 pg (ref 26.0–34.0)
MCHC: 34 g/dL (ref 30.0–36.0)
MCV: 91.3 fL (ref 78.0–100.0)
PLATELETS: 214 10*3/uL (ref 150–400)
RBC: 4.13 MIL/uL (ref 3.87–5.11)
RDW: 11.9 % (ref 11.5–15.5)
WBC: 8.1 10*3/uL (ref 4.0–10.5)

## 2014-05-10 LAB — HCG, QUANTITATIVE, PREGNANCY: hCG, Beta Chain, Quant, S: 52 m[IU]/mL — ABNORMAL HIGH (ref <5)

## 2014-05-10 MED ORDER — HYDROMORPHONE HCL 1 MG/ML IJ SOLN
1.0000 mg | Freq: Once | INTRAMUSCULAR | Status: AC
  Administered 2014-05-10: 1 mg via INTRAMUSCULAR
  Filled 2014-05-10: qty 1

## 2014-05-10 NOTE — Discharge Instructions (Signed)
°Ectopic Pregnancy °An ectopic pregnancy is when the fertilized egg attaches (implants) outside the uterus. Most ectopic pregnancies occur in the fallopian tube. Rarely do ectopic pregnancies occur on the ovary, intestine, pelvis, or cervix. In an ectopic pregnancy, the fertilized egg does not have the ability to develop into a normal, healthy baby.  °A ruptured ectopic pregnancy is one in which the fallopian tube gets torn or bursts and results in internal bleeding. Often there is intense abdominal pain, and sometimes, vaginal bleeding. Having an ectopic pregnancy can be life threatening. If left untreated, this dangerous condition can lead to a blood transfusion, abdominal surgery, or even death. °CAUSES  °Damage to the fallopian tubes is the suspected cause in most ectopic pregnancies.  °RISK FACTORS °Depending on your circumstances, the risk of having an ectopic pregnancy will vary. The level of risk can be divided into three categories. °High Risk °· You have gone through infertility treatment. °· You have had a previous ectopic pregnancy. °· You have had previous tubal surgery. °· You have had previous surgery to have the fallopian tubes tied (tubal ligation). °· You have tubal problems or diseases. °· You have been exposed to DES. DES is a medicine that was used until 1971 and had effects on babies whose mothers took the medicine. °· You become pregnant while using an intrauterine device (IUD) for birth control.  °Moderate Risk °· You have a history of infertility. °· You have a history of a sexually transmitted infection (STI). °· You have a history of pelvic inflammatory disease (PID). °· You have scarring from endometriosis. °· You have multiple sexual partners. °· You smoke.  °Low Risk °· You have had previous pelvic surgery. °· You use vaginal douching. °· You became sexually active before 22 years of age. °SIGNS AND SYMPTOMS  °An ectopic pregnancy should be suspected in anyone who has missed a period  and has abdominal pain or bleeding. °· You may experience normal pregnancy symptoms, such as: °¨ Nausea. °¨ Tiredness. °¨ Breast tenderness. °· Other symptoms may include: °¨ Pain with intercourse. °¨ Irregular vaginal bleeding or spotting. °¨ Cramping or pain on one side or in the lower abdomen. °¨ Fast heartbeat. °¨ Passing out while having a bowel movement. °· Symptoms of a ruptured ectopic pregnancy and internal bleeding may include: °¨ Sudden, severe pain in the abdomen and pelvis. °¨ Dizziness or fainting. °¨ Pain in the shoulder area. °DIAGNOSIS  °Tests that may be performed include: °· A pregnancy test. °· An ultrasound test. °· Testing the specific level of pregnancy hormone in the bloodstream. °· Taking a sample of uterus tissue (dilation and curettage, D&C). °· Surgery to perform a visual exam of the inside of the abdomen using a thin, lighted tube with a tiny camera on the end (laparoscope). °TREATMENT  °An injection of a medicine called methotrexate may be given. This medicine causes the pregnancy tissue to be absorbed. It is given if: °· The diagnosis is made early. °· The fallopian tube has not ruptured. °· You are considered to be a good candidate for the medicine. °Usually, pregnancy hormone blood levels are checked after methotrexate treatment. This is to be sure the medicine is effective. It may take 4-6 weeks for the pregnancy to be absorbed (though most pregnancies will be absorbed by 3 weeks). °Surgical treatment may be needed. A laparoscope may be used to remove the pregnancy tissue. If severe internal bleeding occurs, a cut (incision) may be made in the lower abdomen (laparotomy), and the ectopic   pregnancy is removed. This stops the bleeding. Part of the fallopian tube, or the whole tube, may be removed as well (salpingectomy). After surgery, pregnancy hormone tests may be done to be sure there is no pregnancy tissue left. You may receive a Rho (D) immune globulin shot if you are Rh negative  and the father is Rh positive, or if you do not know the Rh type of the father. This is to prevent problems with any future pregnancy. °SEEK IMMEDIATE MEDICAL CARE IF:  °You have any symptoms of an ectopic pregnancy. This is a medical emergency. °MAKE SURE YOU: °· Understand these instructions. °· Will watch your condition. °· Will get help right away if you are not doing well or get worse. °Document Released: 08/27/2004 Document Revised: 12/04/2013 Document Reviewed: 02/16/2013 °[employer]® Patient Information ©2015 [employer]. This information is not intended to replace advice given to you by your health care provider. Make sure you discuss any questions you have with your health care provider. ° ° °

## 2014-05-10 NOTE — MAU Note (Signed)
Pt reports she got a dose of MTX last week, had sudden onset of severe pain

## 2014-05-10 NOTE — MAU Provider Note (Signed)
History     CSN: [id number]  Arrival date and time: 05/10/14 0306   First Provider Initiated Contact with Patient 05/10/14 [phone]      Chief Complaint  Patient presents with  . Abdominal Pain   HPI This is a 23 y.o. female who presents with c/o sharp intermittent pain in pelvis. States just started tonight. Later, she tells me she has been having pain all week but it got worse tonight. States Percocet has not been helping.  Denies bleeding or other symptoms. Quants had been rising inappropriately and most recent one this week was in the low 100s.   RN note:  Pt reports she got a dose of MTX last week, had sudden onset of severe pain        Pt states that she received Methotrexate at [facility] office a week and a half ago. Pt also states that blood tests have been doe there as well.       OB History   Grav Para Term Preterm Abortions TAB SAB Ect Mult Living   1               History reviewed. No pertinent past medical history.  Past Surgical History  Procedure Laterality Date  . Nose cauterization      Family History  Problem Relation Age of Onset  . Cancer Other   . Thyroid disease Other   . Diabetes Other   . Coronary artery disease Other   . Cancer Maternal Grandmother   . Cancer Maternal Grandfather   . Cancer Paternal Grandmother     History  Substance Use Topics  . Smoking status: Never Smoker   . Smokeless tobacco: Never Used  . Alcohol Use: No     Comment: 3 glasses of wine/week    Allergies:  Allergies  Allergen Reactions  . Macrobid [Nitrofurantoin Monohyd Macro]     Burning in arms  & legs    Prescriptions prior to admission  Medication Sig Dispense Refill  . levonorgestrel (MIRENA) 20 MCG/24HR IUD 1 each by Intrauterine route once.      . metroNIDAZOLE (METROGEL) 0.75 % gel Apply 1 application topically 2 (two) times daily as needed (for irritation).       Marland Kitchen oxyCODONE-acetaminophen (PERCOCET/ROXICET) 5-325 MG per tablet Take 1  tablet by mouth every 4 (four) hours as needed for moderate pain or severe pain.      . valACYclovir (VALTREX) 1000 MG tablet Take 1,000 mg by mouth daily as needed (for outbreaks).         Review of Systems  Constitutional: Negative for fever, chills and malaise/fatigue.  Gastrointestinal: Positive for abdominal pain. Negative for nausea, vomiting, diarrhea and constipation.  Genitourinary: Negative for dysuria.  Neurological: Negative for dizziness.   Physical Exam   Blood pressure 108/61, pulse 74, temperature 99.3 F (37.4 C), temperature source Oral, resp. rate 16, height 5\' 2"  (1.575 m), weight 107 lb 2 oz (48.592 kg), last menstrual period 04/24/2014, SpO2 100.00%.  Physical Exam  Constitutional: She is oriented to person, place, and time. She appears well-developed and well-nourished. No distress.  HENT:  Head: Normocephalic.  Cardiovascular: Normal rate.   Respiratory: Effort normal.  GI: Soft. She exhibits no distension and no mass. There is tenderness (mildly tender lower abdomen). There is no rebound and no guarding.  Genitourinary:  Deferred due to recent exam and methotrexate   Musculoskeletal: Normal range of motion.  Neurological: She is alert and oriented to person, place, and time.  Skin: Skin is warm and dry.  Psychiatric: She has a normal mood and affect.    MAU Course  Procedures  MDM Results for orders placed during the hospital encounter of 05/10/14 (from the past 72 hour(s))  CBC     Status: None   Collection Time    05/10/14  3:45 AM      Result Value Ref Range   WBC 8.1  4.0 - 10.5 K/uL   RBC 4.13  3.87 - 5.11 MIL/uL   Hemoglobin 12.8  12.0 - 15.0 g/dL   HCT 16.137.7  09.636.0 - 04.546.0 %   MCV 91.3  78.0 - 100.0 fL   MCH 31.0  26.0 - 34.0 pg   MCHC 34.0  30.0 - 36.0 g/dL   RDW 40.911.9  81.111.5 - 91.415.5 %   Platelets 214  150 - 400 K/uL  HCG, QUANTITATIVE, PREGNANCY     Status: Abnormal   Collection Time    05/10/14  3:45 AM      Result Value Ref Range    hCG, Beta Chain, Quant, S 52 (*) <5 mIU/mL   Comment:              GEST. AGE      CONC.  (mIU/mL)       <=1 WEEK        5 - 50         2 WEEKS       50 - 500         3 WEEKS       100 - 10,000         4 WEEKS     1,000 - 30,000         5 WEEKS     3,500 - 115,000       6-8 WEEKS     12,000 - 270,000        12 WEEKS     15,000 - 220,000                FEMALE AND NON-PREGNANT FEMALE:         LESS THAN 5 mIU/mL     Assessment and Plan  A:  Resolving ectopic pregnancy of unknown location      Good drop in HCG level      Pelvic pain  P;  Discussed with DR Claiborne Billingsallahan      She wants pt to continue plan with office       I suggested she might try using phenergan with Percocet to potentiate effects.  She will call office tomorrow if not better. Relieved quant is going down       Followup in office  [facility] HospitalWILLIAMS,MARIE 05/10/2014, 3:48 AM

## 2014-05-10 NOTE — MAU Note (Addendum)
Pt states that [facility]. Pt also states that blood tests have been doe there as well. Methotrexate on 9/28

## 2014-05-18 ENCOUNTER — Other Ambulatory Visit: Payer: Self-pay

## 2014-05-31 ENCOUNTER — Encounter (HOSPITAL_COMMUNITY): Payer: Self-pay | Admitting: *Deleted

## 2014-06-01 ENCOUNTER — Encounter (HOSPITAL_COMMUNITY)
Admission: RE | Disposition: A | Discharge status: Home / Self Care | Payer: Self-pay | Source: Ambulatory Visit | Attending: Obstetrics and Gynecology

## 2014-06-01 ENCOUNTER — Encounter (HOSPITAL_COMMUNITY): Payer: Self-pay

## 2014-06-01 ENCOUNTER — Ambulatory Visit (HOSPITAL_COMMUNITY): Payer: 59 | Admitting: Anesthesiology

## 2014-06-01 ENCOUNTER — Ambulatory Visit (HOSPITAL_COMMUNITY)
Admission: RE | Admit: 2014-06-01 | Discharge: 2014-06-01 | Disposition: A | Discharge status: Home / Self Care | Payer: 59 | Source: Ambulatory Visit | Attending: Obstetrics and Gynecology | Admitting: Obstetrics and Gynecology

## 2014-06-01 ENCOUNTER — Encounter (HOSPITAL_COMMUNITY): Payer: 59 | Admitting: Anesthesiology

## 2014-06-01 DIAGNOSIS — T8331XA Breakdown (mechanical) of intrauterine contraceptive device, initial encounter: Secondary | ICD-10-CM

## 2014-06-01 DIAGNOSIS — Z331 Pregnant state, incidental: Secondary | ICD-10-CM

## 2014-06-01 DIAGNOSIS — O9989 Other specified diseases and conditions complicating pregnancy, childbirth and the puerperium: Secondary | ICD-10-CM

## 2014-06-01 DIAGNOSIS — Y763 Surgical instruments, materials and obstetric and gynecological devices (including sutures) associated with adverse incidents: Secondary | ICD-10-CM | POA: Insufficient documentation

## 2014-06-01 DIAGNOSIS — O008 Other ectopic pregnancy without intrauterine pregnancy: Secondary | ICD-10-CM

## 2014-06-01 DIAGNOSIS — T8339XA Other mechanical complication of intrauterine contraceptive device, initial encounter: Secondary | ICD-10-CM | POA: Insufficient documentation

## 2014-06-01 DIAGNOSIS — Z881 Allergy status to other antibiotic agents status: Secondary | ICD-10-CM | POA: Insufficient documentation

## 2014-06-01 HISTORY — PX: HYSTEROSCOPY W/D&C: SHX1775

## 2014-06-01 LAB — COMPREHENSIVE METABOLIC PANEL
ALBUMIN: 4 g/dL (ref 3.5–5.2)
ALT: 22 U/L (ref 0–35)
AST: 20 U/L (ref 0–37)
Alkaline Phosphatase: 44 U/L (ref 39–117)
Anion gap: 9 (ref 5–15)
BUN: 7 mg/dL (ref 6–23)
CALCIUM: 9.4 mg/dL (ref 8.4–10.5)
CO2: 26 meq/L (ref 19–32)
CREATININE: 0.66 mg/dL (ref 0.50–1.10)
Chloride: 104 mEq/L (ref 96–112)
GFR calc Af Amer: >90 mL/min (ref >90)
GFR calc non Af Amer: >90 mL/min (ref >90)
Glucose, Bld: 95 mg/dL (ref 70–99)
Potassium: 3.8 mEq/L (ref 3.7–5.3)
Sodium: 139 mEq/L (ref 137–147)
Total Bilirubin: 0.5 mg/dL (ref 0.3–1.2)
Total Protein: 7.4 g/dL (ref 6.0–8.3)

## 2014-06-01 LAB — CBC
HCT: 37.6 % (ref 36.0–46.0)
Hemoglobin: 12.7 g/dL (ref 12.0–15.0)
MCH: 30.7 pg (ref 26.0–34.0)
MCHC: 33.8 g/dL (ref 30.0–36.0)
MCV: 90.8 fL (ref 78.0–100.0)
PLATELETS: 270 10*3/uL (ref 150–400)
RBC: 4.14 MIL/uL (ref 3.87–5.11)
RDW: 12.8 % (ref 11.5–15.5)
WBC: 10.5 10*3/uL (ref 4.0–10.5)

## 2014-06-01 LAB — HCG, QUANTITATIVE, PREGNANCY: HCG, BETA CHAIN, QUANT, S: 137 m[IU]/mL — AB (ref <5)

## 2014-06-01 LAB — TYPE AND SCREEN
ABO/RH(D): O POS
ANTIBODY SCREEN: NEGATIVE

## 2014-06-01 SURGERY — DILATATION AND CURETTAGE /HYSTEROSCOPY
Anesthesia: General | Site: Vagina

## 2014-06-01 MED ORDER — LACTATED RINGERS IV SOLN
INTRAVENOUS | Status: DC | PRN
  Administered 2014-06-01 (×2): via INTRAVENOUS

## 2014-06-01 MED ORDER — MIDAZOLAM HCL 2 MG/2ML IJ SOLN
INTRAMUSCULAR | Status: AC
  Filled 2014-06-01: qty 2

## 2014-06-01 MED ORDER — DEXAMETHASONE SODIUM PHOSPHATE 10 MG/ML IJ SOLN
INTRAMUSCULAR | Status: AC
  Filled 2014-06-01: qty 1

## 2014-06-01 MED ORDER — DEXAMETHASONE SODIUM PHOSPHATE 10 MG/ML IJ SOLN
INTRAMUSCULAR | Status: DC | PRN
  Administered 2014-06-01: 5 mg via INTRAVENOUS

## 2014-06-01 MED ORDER — NEOSTIGMINE METHYLSULFATE 10 MG/10ML IV SOLN
INTRAVENOUS | Status: AC
  Filled 2014-06-01: qty 1

## 2014-06-01 MED ORDER — PROPOFOL 10 MG/ML IV BOLUS
INTRAVENOUS | Status: DC | PRN
Start: 2014-06-01 — End: 2014-06-01
  Administered 2014-06-01: 150 mg via INTRAVENOUS

## 2014-06-01 MED ORDER — NEOSTIGMINE METHYLSULFATE 10 MG/10ML IV SOLN
INTRAVENOUS | Status: DC | PRN
  Administered 2014-06-01: 3 mg via INTRAVENOUS

## 2014-06-01 MED ORDER — FENTANYL CITRATE 0.05 MG/ML IJ SOLN
INTRAMUSCULAR | Status: AC
  Filled 2014-06-01: qty 2

## 2014-06-01 MED ORDER — MEDROXYPROGESTERONE ACETATE 150 MG/ML IM SUSP: 150.0000 mg | INTRAMUSCULAR | Status: DC

## 2014-06-01 MED ORDER — PHENYLEPHRINE HCL 10 MG/ML IJ SOLN
INTRAMUSCULAR | Status: DC | PRN
  Administered 2014-06-01: 40 ug via INTRAVENOUS

## 2014-06-01 MED ORDER — FENTANYL CITRATE 0.05 MG/ML IJ SOLN
25.0000 ug | INTRAMUSCULAR | Status: DC | PRN
  Administered 2014-06-01: 50 ug via INTRAVENOUS

## 2014-06-01 MED ORDER — LACTATED RINGERS IV SOLN
INTRAVENOUS | Status: DC
  Administered 2014-06-01: 08:00:00 via INTRAVENOUS

## 2014-06-01 MED ORDER — MIDAZOLAM HCL 2 MG/2ML IJ SOLN
INTRAMUSCULAR | Status: DC | PRN
  Administered 2014-06-01: 2 mg via INTRAVENOUS

## 2014-06-01 MED ORDER — LIDOCAINE HCL (CARDIAC) 20 MG/ML IV SOLN
INTRAVENOUS | Status: DC | PRN
  Administered 2014-06-01: 50 mg via INTRAVENOUS

## 2014-06-01 MED ORDER — ACETAMINOPHEN 160 MG/5ML PO SOLN
ORAL | Status: AC
  Administered 2014-06-01: 960 mg via ORAL
  Filled 2014-06-01: qty 40.6

## 2014-06-01 MED ORDER — KETOROLAC TROMETHAMINE 30 MG/ML IJ SOLN
INTRAMUSCULAR | Status: DC | PRN
  Administered 2014-06-01: 30 mg via INTRAVENOUS

## 2014-06-01 MED ORDER — BUPIVACAINE HCL (PF) 0.25 % IJ SOLN
INTRAMUSCULAR | Status: AC
  Filled 2014-06-01: qty 30

## 2014-06-01 MED ORDER — MEDROXYPROGESTERONE ACETATE 150 MG/ML IM SUSP
INTRAMUSCULAR | Status: AC
  Filled 2014-06-01: qty 1

## 2014-06-01 MED ORDER — GLYCOPYRROLATE 0.2 MG/ML IJ SOLN
INTRAMUSCULAR | Status: DC | PRN
  Administered 2014-06-01: 0.1 mg via INTRAVENOUS
  Administered 2014-06-01: 0.4 mg via INTRAVENOUS

## 2014-06-01 MED ORDER — ONDANSETRON HCL 4 MG/2ML IJ SOLN
INTRAMUSCULAR | Status: DC | PRN
  Administered 2014-06-01: 4 mg via INTRAVENOUS

## 2014-06-01 MED ORDER — ACETAMINOPHEN 160 MG/5ML PO SOLN
960.0000 mg | Freq: Four times a day (QID) | ORAL | Status: DC | PRN
  Administered 2014-06-01: 960 mg via ORAL

## 2014-06-01 MED ORDER — LIDOCAINE HCL 1 % IJ SOLN
INTRAMUSCULAR | Status: DC | PRN
  Administered 2014-06-01: 10 mL

## 2014-06-01 MED ORDER — ROCURONIUM BROMIDE 100 MG/10ML IV SOLN
INTRAVENOUS | Status: DC | PRN
  Administered 2014-06-01: 25 mg via INTRAVENOUS

## 2014-06-01 MED ORDER — FENTANYL CITRATE 0.05 MG/ML IJ SOLN
INTRAMUSCULAR | Status: AC
  Filled 2014-06-01: qty 5

## 2014-06-01 MED ORDER — SCOPOLAMINE 1 MG/3DAYS TD PT72
MEDICATED_PATCH | TRANSDERMAL | Status: AC
  Administered 2014-06-01: 1.5 mg via TRANSDERMAL
  Filled 2014-06-01: qty 1

## 2014-06-01 MED ORDER — FENTANYL CITRATE 0.05 MG/ML IJ SOLN
INTRAMUSCULAR | Status: DC | PRN
  Administered 2014-06-01: 100 ug via INTRAVENOUS
  Administered 2014-06-01 (×2): 50 ug via INTRAVENOUS

## 2014-06-01 MED ORDER — OXYCODONE-ACETAMINOPHEN 5-325 MG PO TABS: 2.0000 | ORAL_TABLET | ORAL | Status: DC | PRN

## 2014-06-01 MED ORDER — MEDROXYPROGESTERONE ACETATE 150 MG/ML IM SUSP
150.0000 mg | Freq: Once | INTRAMUSCULAR | Status: AC
  Administered 2014-06-01: 150 mg via INTRAMUSCULAR

## 2014-06-01 MED ORDER — PROPOFOL 10 MG/ML IV EMUL
INTRAVENOUS | Status: AC
  Filled 2014-06-01: qty 20

## 2014-06-01 MED ORDER — GLYCOPYRROLATE 0.2 MG/ML IJ SOLN
INTRAMUSCULAR | Status: AC
  Filled 2014-06-01: qty 3

## 2014-06-01 MED ORDER — ONDANSETRON HCL 4 MG/2ML IJ SOLN
INTRAMUSCULAR | Status: AC
  Filled 2014-06-01: qty 2

## 2014-06-01 MED ORDER — SCOPOLAMINE 1 MG/3DAYS TD PT72
1.0000 | MEDICATED_PATCH | Freq: Once | TRANSDERMAL | Status: DC
  Administered 2014-06-01: 1.5 mg via TRANSDERMAL

## 2014-06-01 MED ORDER — LIDOCAINE HCL 1 % IJ SOLN
INTRAMUSCULAR | Status: AC
  Filled 2014-06-01: qty 20

## 2014-06-01 MED ORDER — LIDOCAINE HCL (CARDIAC) 20 MG/ML IV SOLN
INTRAVENOUS | Status: AC
  Filled 2014-06-01: qty 5

## 2014-06-01 MED ORDER — PHENYLEPHRINE 40 MCG/ML (10ML) SYRINGE FOR IV PUSH (FOR BLOOD PRESSURE SUPPORT)
PREFILLED_SYRINGE | INTRAVENOUS | Status: AC
  Filled 2014-06-01: qty 5

## 2014-06-01 MED ORDER — LACTATED RINGERS IV SOLN: INTRAVENOUS | Status: DC

## 2014-06-01 SURGICAL SUPPLY — 39 items
ABLATOR ENDOMETRIAL BIPOLAR (ABLATOR) IMPLANT
BAG SPEC RTRVL LRG 6X4 10 (ENDOMECHANICALS)
BLADE SURG 11 STRL SS (BLADE) ×4 IMPLANT
CANISTER SUCT 3000ML (MISCELLANEOUS) ×4 IMPLANT
CATH ROBINSON RED A/P 16FR (CATHETERS) ×4 IMPLANT
CLOTH BEACON ORANGE TIMEOUT ST (SAFETY) ×4 IMPLANT
CONT PATH 16OZ SNAP LID 3702 (MISCELLANEOUS) IMPLANT
CONTAINER PREFILL 10% NBF 60ML (FORM) ×8 IMPLANT
DECANTER SPIKE VIAL GLASS SM (MISCELLANEOUS) ×6 IMPLANT
DILATOR CANAL MILEX (MISCELLANEOUS) IMPLANT
DRSG COVADERM PLUS 2X2 (GAUZE/BANDAGES/DRESSINGS) ×8 IMPLANT
DRSG OPSITE POSTOP 3X4 (GAUZE/BANDAGES/DRESSINGS) IMPLANT
ELECT REM PT RETURN 9FT ADLT (ELECTROSURGICAL)
ELECTRODE REM PT RTRN 9FT ADLT (ELECTROSURGICAL) IMPLANT
FORCEPS CUTTING 33CM 5MM (CUTTING FORCEPS) IMPLANT
FORCEPS CUTTING 45CM 5MM (CUTTING FORCEPS) IMPLANT
GLOVE BIO SURGEON STRL SZ7 (GLOVE) ×4 IMPLANT
GOWN STRL REUS W/TWL LRG LVL3 (GOWN DISPOSABLE) ×8 IMPLANT
LIQUID BAND (GAUZE/BANDAGES/DRESSINGS) IMPLANT
NDL SPNL 22GX3.5 QUINCKE BK (NEEDLE) IMPLANT
NEEDLE HYPO 22GX1.5 SAFETY (NEEDLE) IMPLANT
NEEDLE SPNL 22GX3.5 QUINCKE BK (NEEDLE) ×4 IMPLANT
NS IRRIG 1000ML POUR BTL (IV SOLUTION) ×4 IMPLANT
PACK LAPAROSCOPY BASIN (CUSTOM PROCEDURE TRAY) ×4 IMPLANT
PACK VAGINAL MINOR WOMEN LF (CUSTOM PROCEDURE TRAY) ×4 IMPLANT
PAD OB MATERNITY 4.3X12.25 (PERSONAL CARE ITEMS) ×4 IMPLANT
PAD TRENDELENBURG OR TABLE (MISCELLANEOUS) ×4 IMPLANT
POUCH SPECIMEN RETRIEVAL 10MM (ENDOMECHANICALS) IMPLANT
PROTECTOR NERVE ULNAR (MISCELLANEOUS) ×4 IMPLANT
SET TUBING HYSTEROSCOPY 2 NDL (TUBING) IMPLANT
SUT VIC AB 3-0 PS2 18 (SUTURE) ×4
SUT VIC AB 3-0 PS2 18XBRD (SUTURE) ×2 IMPLANT
SUT VICRYL 0 UR6 27IN ABS (SUTURE) ×8 IMPLANT
TOWEL OR 17X24 6PK STRL BLUE (TOWEL DISPOSABLE) ×8 IMPLANT
TROCAR BALLN 12MMX100 BLUNT (TROCAR) ×4 IMPLANT
TROCAR OPTI TIP 5M 100M (ENDOMECHANICALS) ×8 IMPLANT
TUBE HYSTEROSCOPY W Y-CONNECT (TUBING) IMPLANT
WARMER LAPAROSCOPE (MISCELLANEOUS) ×4 IMPLANT
WATER STERILE IRR 1000ML POUR (IV SOLUTION) ×4 IMPLANT

## 2014-06-01 NOTE — Transfer of Care (Signed)
Immediate Anesthesia Transfer of Care Note  [name]  Procedure(s) Performed: Procedure(s) with comments: DILATATION AND CURETTAGE WITH FROZEN SECTION (N/A) - WITH FROZEN SECTION  Patient Location: PACU  Anesthesia Type:General  Level of Consciousness: awake, alert  and oriented  Airway & Oxygen Therapy: Patient Spontanous Breathing and Patient connected to nasal cannula oxygen  Post-op Assessment: Report given to PACU RN and Post -op Vital signs reviewed and stable  Post vital signs: Reviewed and stable  Complications: No apparent anesthesia complications

## 2014-06-01 NOTE — Op Note (Signed)
Pre-Operative Diagnosis: 1) intrauterine device failure with resultant pregnancy of unknown location 2)failed medical management of pregnancy of unknown location Postoperative Diagnosis: same Procedure: suction dilation and curettage with removal of intrauterine device Surgeon: Dr. Waynard ReedsKendra Barabara Motz Assistant:none Operative Findings: intrauterine device within the endometrial cavity. Necrotic products of conception. Chorionic villi identified on frozen section Specimen: products of conception EBL: minimal  [name] Is a 23 year old gravida 1 para 0 who presents for definitive surgical management for cervical ectopic pregnancy resulting after a IUD failure. Please see the patient's history and physical for complete details of the history. A trial of medical management with methotrexate was attempted with 2 doses of methotrexate. Initially the patient responded with a dropping quantitative hCG, however her quantitative hCG began rising again. Following a second dose of methotrexate there was no decline in the quantitative hCG. Management options were discussed with the patient. R/B/A reviewed. Given failure of medical management the decision was made to proceed with surgical management. Based on imaging studies it was felt that the pregnancy was located at the internal cervical os. However, the exact location of the pregnancy was not definitive prior to surgery. The surgical plan was for dilation and curettage with frozen section. If Chorionic villi were not identified then we would proceed with diagnostic laparoscopy. Following appropriate informed consent was taken to the operating room. The patient was appropriately identified during a time out procedure. General anesthesia was administered and the patient was placed in the dorsal lithotomy position. The patient was prepped and draped in the normal sterile fashion. A speculum was placed into the vagina, a single-tooth tenaculum was placed on the anterior lip  of the cervix, and 10 cc of 1% lidocaine was administered in a paracervical fashion. The cervix was serially dilated with Hank dilators. The intrauterine device was removed with uterine forceps.a sharp curettage was then performed with removal of copious necrotic tissue.the frozen section revealed chorionic villi consistent with products of conception. The uterus was completely evacuated until a gritty texture was noted. This completed the procedure. The single-tooth tenaculum was reviewed from the anterior lip of the cervix. Cervix was found to be hemostatic. The speculum was removed from the vagina and the patient was partially recovery room in stable condition following the procedure.

## 2014-06-01 NOTE — Anesthesia Postprocedure Evaluation (Signed)
  Anesthesia Post-op Note  [name]  Procedure(s) Performed: Procedure(s) with comments: DILATATION AND CURETTAGE WITH FROZEN SECTION, Removal of IUD (N/A) - WITH FROZEN SECTION  Patient Location: PACU  Anesthesia Type:General  Level of Consciousness: awake, alert  and oriented  Airway and Oxygen Therapy: Patient Spontanous Breathing  Post-op Pain: none  Post-op Assessment: Post-op Vital signs reviewed, Patient's Cardiovascular Status Stable, Respiratory Function Stable, Patent Airway, No signs of Nausea or vomiting and Pain level controlled  Post-op Vital Signs: Reviewed and stable  Last Vitals:  Filed Vitals:   06/01/14 1030  BP: 95/59  Pulse: 89  Temp:   Resp: 21    Complications: No apparent anesthesia complications

## 2014-06-01 NOTE — H&P (Signed)
Belinda Avery is an 23 y.o. female.surgical management of an ectopic pregnancy  23 year old G1 P0 who was initially diagnosed with a positive pregnancy test on 04/24/2014. The patient has a Mirena intrauterine device in place and presented to the emergency room with heavy vaginal bleeding. She had a positive home pregnancy test. Her quantitative hCG was 89 at this time. 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 She was followed with serial quants for a week. Her pregnancy hormone level did not rise. It was suspected that the pregnancy was a cervical ectopic given the ultrasound findings and the decision was made to proceed with methotrexate. She is now status post 2 rounds of methotrexate 50 mg per metered square. Her quantitative hCG has continued to rise most recently it was 179 on a [facility] test. Given failure of resolution of the suspected ectopic the decision was made to proceed with surgical management. Will plan for dilation and curettage with frozen section. Frozen section fails to demonstrate chorionic villi then we will proceed with laparoscopy. Risks, benefits, alternatives of the procedure were discussed with the patient at length. Informed consent was obtained and she wishes to proceed.   Patient's last menstrual period was 04/24/2014.    History reviewed. No pertinent past medical history.  Past Surgical History  Procedure Laterality Date  . Nose cauterization      Family History  Problem Relation Age of Onset  . Cancer Other   . Thyroid disease Other   . Diabetes Other   . Coronary artery disease Other   . Cancer Maternal Grandmother   . Cancer Maternal Grandfather   . Cancer Paternal Grandmother     Social History:  reports that she has never smoked. She has never used smokeless tobacco. She reports that she drinks alcohol. She reports that she does not use illicit drugs.  Allergies:  Allergies  Allergen Reactions  . Macrobid [Nitrofurantoin Monohyd Macro]      Burning in arms  & legs    Prescriptions prior to admission  Medication Sig Dispense Refill  . levonorgestrel (MIRENA) 20 MCG/24HR IUD 1 each by Intrauterine route once.      . metroNIDAZOLE (METROGEL) 0.75 % gel Apply 1 application topically 2 (two) times daily as needed (for irritation).       Marland Kitchen. oxyCODONE-acetaminophen (PERCOCET/ROXICET) 5-325 MG per tablet Take 1 tablet by mouth every 4 (four) hours as needed for moderate pain or severe pain.      . valACYclovir (VALTREX) 1000 MG tablet Take 1,000 mg by mouth daily as needed (for outbreaks).         ROS: as above  Blood pressure 106/70, pulse 84, temperature 98.4 F (36.9 C), temperature source Oral, resp. rate 16, height 5\' 2"  (1.575 m), weight 48.988 kg (108 lb), last menstrual period 04/24/2014, SpO2 99.00%, unknown if currently breastfeeding. Physical Exam  Results for orders placed during the hospital encounter of 06/01/14 (from the past 24 hour(s))  CBC     Status: None   Collection Time    06/01/14  7:20 AM      Result Value Ref Range   WBC 10.5  4.0 - 10.5 K/uL   RBC 4.14  3.87 - 5.11 MIL/uL   Hemoglobin 12.7  12.0 - 15.0 g/dL   HCT 40.937.6  81.136.0 - 91.446.0 %   MCV 90.8  78.0 - 100.0 fL   MCH 30.7  26.0 - 34.0 pg   MCHC 33.8  30.0 - 36.0 g/dL   RDW  12.8  11.5 - 15.5 %   Platelets 270  150 - 400 K/uL  COMPREHENSIVE METABOLIC PANEL     Status: None   Collection Time    06/01/14  7:20 AM      Result Value Ref Range   Sodium 139  137 - 147 mEq/L   Potassium 3.8  3.7 - 5.3 mEq/L   Chloride 104  96 - 112 mEq/L   CO2 26  19 - 32 mEq/L   Glucose, Bld 95  70 - 99 mg/dL   BUN 7  6 - 23 mg/dL   Creatinine, Ser 1.610.66  0.50 - 1.10 mg/dL   Calcium 9.4  8.4 - 09.610.5 mg/dL   Total Protein 7.4  6.0 - 8.3 g/dL   Albumin 4.0  3.5 - 5.2 g/dL   AST 20  0 - 37 U/L   ALT 22  0 - 35 U/L   Alkaline Phosphatase 44  39 - 117 U/L   Total Bilirubin 0.5  0.3 - 1.2 mg/dL   GFR calc non Af Amer >90  >90 mL/min   GFR calc Af Amer >90  >90  mL/min   Anion gap 9  5 - 15  TYPE AND SCREEN     Status: None   Collection Time    06/01/14  7:20 AM      Result Value Ref Range   ABO/RH(D) PENDING     Antibody Screen NEG     Sample Expiration 06/04/2014    HCG, QUANTITATIVE, PREGNANCY     Status: Abnormal   Collection Time    06/01/14  7:20 AM      Result Value Ref Range   hCG, Beta Chain, Quant, S 137 (*) <5 mIU/mL    No results found.  Assessment/Plan: 1) appropriate informed consent was obtained 2) we'll proceed with dilation curettage with frozen section after removal of intrauterine device. If frozen section fails to show chorionic villi will proceed with diagnostic laparoscopy. Risks benefits and alternatives were reviewed  Dantavious Snowball H. 06/01/2014, 8:27 AM

## 2014-06-01 NOTE — Anesthesia Preprocedure Evaluation (Signed)

## 2014-06-01 NOTE — Anesthesia Procedure Notes (Signed)
Procedure Name: Intubation Date/Time: 06/01/2014 8:48 AM Performed by: Graciela HusbandsFUSSELL, Aristotelis Vilardi O Pre-anesthesia Checklist: Patient being monitored, Suction available, Emergency Drugs available, Patient identified and Timeout performed Patient Re-evaluated:Patient Re-evaluated prior to inductionOxygen Delivery Method: Circle system utilized Preoxygenation: Pre-oxygenation with 100% oxygen Intubation Type: IV induction Ventilation: Mask ventilation without difficulty Laryngoscope Size: Mac and 3 Grade View: Grade I Tube size: 7.0 mm Number of attempts: 1 Airway Equipment and Method: Stylet Placement Confirmation: ETT inserted through vocal cords under direct vision,  breath sounds checked- equal and bilateral and positive ETCO2 Secured at: 20 cm Tube secured with: Tape Dental Injury: Teeth and Oropharynx as per pre-operative assessment

## 2014-06-01 NOTE — Discharge Instructions (Signed)

## 2014-06-04 ENCOUNTER — Encounter (HOSPITAL_COMMUNITY): Payer: Self-pay | Admitting: Obstetrics and Gynecology

## 2015-05-16 ENCOUNTER — Other Ambulatory Visit: Payer: Self-pay | Admitting: Obstetrics and Gynecology

## 2015-05-17 LAB — CYTOLOGY - PAP

## 2017-09-02 ENCOUNTER — Encounter (HOSPITAL_COMMUNITY): Payer: Self-pay

## 2017-09-02 ENCOUNTER — Other Ambulatory Visit: Payer: Self-pay

## 2017-09-02 ENCOUNTER — Inpatient Hospital Stay (HOSPITAL_COMMUNITY)
Admission: EM | Admit: 2017-09-02 | Discharge: 2017-09-04 | DRG: 918 | Disposition: A | Discharge status: Other Acute Inpatient Hospital | Payer: 59 | Attending: Internal Medicine | Admitting: Internal Medicine

## 2017-09-02 DIAGNOSIS — F419 Anxiety disorder, unspecified: Secondary | ICD-10-CM | POA: Diagnosis not present

## 2017-09-02 DIAGNOSIS — Z630 Problems in relationship with spouse or partner: Secondary | ICD-10-CM | POA: Diagnosis not present

## 2017-09-02 DIAGNOSIS — I952 Hypotension due to drugs: Secondary | ICD-10-CM | POA: Diagnosis present

## 2017-09-02 DIAGNOSIS — F4325 Adjustment disorder with mixed disturbance of emotions and conduct: Secondary | ICD-10-CM

## 2017-09-02 DIAGNOSIS — Y907 Blood alcohol level of 200-239 mg/100 ml: Secondary | ICD-10-CM | POA: Diagnosis present

## 2017-09-02 DIAGNOSIS — Z566 Other physical and mental strain related to work: Secondary | ICD-10-CM | POA: Diagnosis not present

## 2017-09-02 DIAGNOSIS — F32A Depression, unspecified: Secondary | ICD-10-CM

## 2017-09-02 DIAGNOSIS — R450 Nervousness: Secondary | ICD-10-CM | POA: Diagnosis not present

## 2017-09-02 DIAGNOSIS — F1099 Alcohol use, unspecified with unspecified alcohol-induced disorder: Secondary | ICD-10-CM | POA: Diagnosis not present

## 2017-09-02 DIAGNOSIS — T424X2A Poisoning by benzodiazepines, intentional self-harm, initial encounter: Secondary | ICD-10-CM | POA: Diagnosis not present

## 2017-09-02 DIAGNOSIS — F332 Major depressive disorder, recurrent severe without psychotic features: Secondary | ICD-10-CM | POA: Diagnosis not present

## 2017-09-02 DIAGNOSIS — T1491XA Suicide attempt, initial encounter: Secondary | ICD-10-CM | POA: Diagnosis not present

## 2017-09-02 DIAGNOSIS — Z91411 Personal history of adult psychological abuse: Secondary | ICD-10-CM | POA: Diagnosis not present

## 2017-09-02 DIAGNOSIS — Z598 Other problems related to housing and economic circumstances: Secondary | ICD-10-CM | POA: Diagnosis not present

## 2017-09-02 DIAGNOSIS — R45851 Suicidal ideations: Secondary | ICD-10-CM | POA: Diagnosis present

## 2017-09-02 DIAGNOSIS — Y92002 Bathroom of unspecified non-institutional (private) residence single-family (private) house as the place of occurrence of the external cause: Secondary | ICD-10-CM

## 2017-09-02 DIAGNOSIS — T402X2A Poisoning by other opioids, intentional self-harm, initial encounter: Secondary | ICD-10-CM | POA: Diagnosis present

## 2017-09-02 DIAGNOSIS — T426X2A Poisoning by other antiepileptic and sedative-hypnotic drugs, intentional self-harm, initial encounter: Secondary | ICD-10-CM | POA: Diagnosis not present

## 2017-09-02 DIAGNOSIS — Z91410 Personal history of adult physical and sexual abuse: Secondary | ICD-10-CM | POA: Diagnosis not present

## 2017-09-02 DIAGNOSIS — Z62811 Personal history of psychological abuse in childhood: Secondary | ICD-10-CM | POA: Diagnosis present

## 2017-09-02 DIAGNOSIS — T43222A Poisoning by selective serotonin reuptake inhibitors, intentional self-harm, initial encounter: Principal | ICD-10-CM | POA: Diagnosis present

## 2017-09-02 DIAGNOSIS — T50902A Poisoning by unspecified drugs, medicaments and biological substances, intentional self-harm, initial encounter: Secondary | ICD-10-CM | POA: Diagnosis not present

## 2017-09-02 DIAGNOSIS — Z881 Allergy status to other antibiotic agents status: Secondary | ICD-10-CM

## 2017-09-02 DIAGNOSIS — Z6379 Other stressful life events affecting family and household: Secondary | ICD-10-CM | POA: Diagnosis not present

## 2017-09-02 DIAGNOSIS — T50901A Poisoning by unspecified drugs, medicaments and biological substances, accidental (unintentional), initial encounter: Secondary | ICD-10-CM | POA: Diagnosis present

## 2017-09-02 DIAGNOSIS — F10129 Alcohol abuse with intoxication, unspecified: Secondary | ICD-10-CM | POA: Diagnosis not present

## 2017-09-02 DIAGNOSIS — D72829 Elevated white blood cell count, unspecified: Secondary | ICD-10-CM | POA: Diagnosis present

## 2017-09-02 DIAGNOSIS — R000 Tachycardia, unspecified: Secondary | ICD-10-CM | POA: Diagnosis present

## 2017-09-02 DIAGNOSIS — R443 Hallucinations, unspecified: Secondary | ICD-10-CM | POA: Diagnosis not present

## 2017-09-02 DIAGNOSIS — T510X2A Toxic effect of ethanol, intentional self-harm, initial encounter: Secondary | ICD-10-CM | POA: Diagnosis present

## 2017-09-02 DIAGNOSIS — Z975 Presence of (intrauterine) contraceptive device: Secondary | ICD-10-CM | POA: Diagnosis not present

## 2017-09-02 DIAGNOSIS — T391X2A Poisoning by 4-Aminophenol derivatives, intentional self-harm, initial encounter: Secondary | ICD-10-CM | POA: Diagnosis present

## 2017-09-02 DIAGNOSIS — Z62810 Personal history of physical and sexual abuse in childhood: Secondary | ICD-10-CM | POA: Diagnosis present

## 2017-09-02 DIAGNOSIS — T398X2A Poisoning by other nonopioid analgesics and antipyretics, not elsewhere classified, intentional self-harm, initial encounter: Secondary | ICD-10-CM | POA: Diagnosis present

## 2017-09-02 DIAGNOSIS — F329 Major depressive disorder, single episode, unspecified: Secondary | ICD-10-CM

## 2017-09-02 DIAGNOSIS — R330 Drug induced retention of urine: Secondary | ICD-10-CM | POA: Diagnosis present

## 2017-09-02 DIAGNOSIS — F418 Other specified anxiety disorders: Secondary | ICD-10-CM | POA: Diagnosis present

## 2017-09-02 HISTORY — DX: Major depressive disorder, single episode, unspecified: F32.9

## 2017-09-02 HISTORY — DX: Depression, unspecified: F32.A

## 2017-09-02 HISTORY — DX: Anxiety disorder, unspecified: F41.9

## 2017-09-02 LAB — COMPREHENSIVE METABOLIC PANEL
ALBUMIN: 4.7 g/dL (ref 3.5–5.0)
ALK PHOS: 34 U/L — AB (ref 38–126)
ALT: 15 U/L (ref 14–54)
ALT: 11 U/L — ABNORMAL LOW (ref 14–54)
ANION GAP: 4 — AB (ref 5–15)
AST: 23 U/L (ref 15–41)
AST: 18 U/L (ref 15–41)
Albumin: 3.6 g/dL (ref 3.5–5.0)
Alkaline Phosphatase: 40 U/L (ref 38–126)
Anion gap: 6 (ref 5–15)
BILIRUBIN TOTAL: 0.3 mg/dL (ref 0.3–1.2)
BUN: 6 mg/dL (ref 6–20)
BUN: 7 mg/dL (ref 6–20)
CALCIUM: 7.9 mg/dL — AB (ref 8.9–10.3)
CHLORIDE: 109 mmol/L (ref 101–111)
CO2: 25 mmol/L (ref 22–32)
CO2: 25 mmol/L (ref 22–32)
CREATININE: 0.66 mg/dL (ref 0.44–1.00)
Calcium: 9.3 mg/dL (ref 8.9–10.3)
Chloride: 110 mmol/L (ref 101–111)
Creatinine, Ser: 0.83 mg/dL (ref 0.44–1.00)
GFR calc Af Amer: >60 mL/min (ref >60)
GFR calc non Af Amer: >60 mL/min (ref >60)
Glucose, Bld: 95 mg/dL (ref 65–99)
Glucose, Bld: 107 mg/dL — ABNORMAL HIGH (ref 65–99)
POTASSIUM: 3.7 mmol/L (ref 3.5–5.1)
Potassium: 3.7 mmol/L (ref 3.5–5.1)
SODIUM: 139 mmol/L (ref 135–145)
Sodium: 140 mmol/L (ref 135–145)
TOTAL PROTEIN: 6.2 g/dL — AB (ref 6.5–8.1)
Total Bilirubin: 0.5 mg/dL (ref 0.3–1.2)
Total Protein: 7.8 g/dL (ref 6.5–8.1)

## 2017-09-02 LAB — URINALYSIS, ROUTINE W REFLEX MICROSCOPIC
BILIRUBIN URINE: NEGATIVE
Bacteria, UA: NONE SEEN
GLUCOSE, UA: NEGATIVE mg/dL
KETONES UR: NEGATIVE mg/dL
LEUKOCYTES UA: NEGATIVE
NITRITE: POSITIVE — AB
PH: 6 (ref 5.0–8.0)
Protein, ur: NEGATIVE mg/dL
SPECIFIC GRAVITY, URINE: 1.005 (ref 1.005–1.030)
Squamous Epithelial / LPF: NONE SEEN

## 2017-09-02 LAB — I-STAT BETA HCG BLOOD, ED (MC, WL, AP ONLY): I-stat hCG, quantitative: <5 m[IU]/mL (ref <5)

## 2017-09-02 LAB — RAPID URINE DRUG SCREEN, HOSP PERFORMED
Amphetamines: NOT DETECTED
Barbiturates: NOT DETECTED
Benzodiazepines: NOT DETECTED
Cocaine: NOT DETECTED
OPIATES: POSITIVE — AB
TETRAHYDROCANNABINOL: NOT DETECTED

## 2017-09-02 LAB — ETHANOL: Alcohol, Ethyl (B): 201 mg/dL — ABNORMAL HIGH (ref <10)

## 2017-09-02 LAB — ACETAMINOPHEN LEVEL
ACETAMINOPHEN (TYLENOL), SERUM: 36 ug/mL — AB (ref 10–30)
ACETAMINOPHEN (TYLENOL), SERUM: 43 ug/mL — AB (ref 10–30)
Acetaminophen (Tylenol), Serum: 20 ug/mL (ref 10–30)

## 2017-09-02 LAB — CBC
HCT: 40.8 % (ref 36.0–46.0)
HEMOGLOBIN: 14.2 g/dL (ref 12.0–15.0)
MCH: 31.6 pg (ref 26.0–34.0)
MCHC: 34.8 g/dL (ref 30.0–36.0)
MCV: 90.7 fL (ref 78.0–100.0)
PLATELETS: 311 10*3/uL (ref 150–400)
RBC: 4.5 MIL/uL (ref 3.87–5.11)
RDW: 12.2 % (ref 11.5–15.5)
WBC: 9.8 10*3/uL (ref 4.0–10.5)

## 2017-09-02 LAB — SALICYLATE LEVEL

## 2017-09-02 MED ORDER — SODIUM CHLORIDE 0.9 % IV BOLUS (SEPSIS)
1000.0000 mL | Freq: Once | INTRAVENOUS | Status: AC
  Administered 2017-09-02: 1000 mL via INTRAVENOUS

## 2017-09-02 MED ORDER — HYDRALAZINE HCL 20 MG/ML IJ SOLN
10.0000 mg | Freq: Three times a day (TID) | INTRAMUSCULAR | Status: DC | PRN
  Filled 2017-09-02: qty 0.5

## 2017-09-02 MED ORDER — ONDANSETRON HCL 4 MG/2ML IJ SOLN: 4.0000 mg | Freq: Four times a day (QID) | INTRAMUSCULAR | Status: DC | PRN

## 2017-09-02 MED ORDER — ENOXAPARIN SODIUM 40 MG/0.4ML ~~LOC~~ SOLN
40.0000 mg | SUBCUTANEOUS | Status: DC
  Administered 2017-09-02 – 2017-09-03 (×2): 40 mg via SUBCUTANEOUS
  Filled 2017-09-02 (×2): qty 0.4

## 2017-09-02 MED ORDER — ONDANSETRON HCL 4 MG PO TABS: 4.0000 mg | ORAL_TABLET | Freq: Four times a day (QID) | ORAL | Status: DC | PRN

## 2017-09-02 MED ORDER — ACETYLCYSTEINE LOAD VIA INFUSION: 150.0000 mg/kg | Freq: Once | INTRAVENOUS | Status: DC

## 2017-09-02 MED ORDER — ONDANSETRON HCL 4 MG/2ML IJ SOLN: 4.0000 mg | Freq: Once | INTRAMUSCULAR | Status: DC

## 2017-09-02 MED ORDER — FLUMAZENIL 0.5 MG/5ML IV SOLN: 0.3000 mg | Freq: Once | INTRAVENOUS | Status: DC | PRN

## 2017-09-02 MED ORDER — DEXTROSE 5 % IV SOLN: 15.0000 mg/kg/h | INTRAVENOUS | Status: DC

## 2017-09-02 MED ORDER — NALOXONE HCL 0.4 MG/ML IJ SOLN: 0.4000 mg | INTRAMUSCULAR | Status: DC | PRN

## 2017-09-02 MED ORDER — SODIUM CHLORIDE 0.9 % IV SOLN
INTRAVENOUS | Status: DC
  Administered 2017-09-02: 1000 mL via INTRAVENOUS

## 2017-09-02 MED ORDER — IBUPROFEN 200 MG PO TABS
400.0000 mg | ORAL_TABLET | Freq: Four times a day (QID) | ORAL | Status: DC | PRN
  Administered 2017-09-03 (×2): 400 mg via ORAL
  Filled 2017-09-02 (×2): qty 2

## 2017-09-02 MED ORDER — SODIUM CHLORIDE 0.9 % IV BOLUS (SEPSIS)
500.0000 mL | Freq: Once | INTRAVENOUS | Status: AC
  Administered 2017-09-02: 500 mL via INTRAVENOUS

## 2017-09-02 NOTE — ED Notes (Signed)
Dr. Melynda RippleHobbs returned call & informed of pt's inability to void even though pt feels the urge.

## 2017-09-02 NOTE — ED Provider Notes (Signed)
[facility]-EMERGENCY DEPT Provider Note   CSN: [id number] Arrival date & time: 09/02/17  0515  Time seen 05:30 AM   History   Chief Complaint Chief Complaint  Patient presents with  . Drug Overdose    HPI Belinda Avery is a 27 y.o. female.  HPI patient reports about 20 minutes ago she took her own pills.  She states all the pill bottles had some pills in them.  She states that the time she took the pills "I was okay with dying".  She then however called her sister to tell her sister what she did so her sister would know.  Her sister called for help.  She states she is never done anything like this before.  She states she started having depression in college.  She told me she had never seen a psychiatrist or therapist before, however her husband states he did not think that was true.  She then told me she did see a therapist before and was put on Prozac for a few months which made her feel happier.  She states however she did not feel like she should be taking a drug to make herself feel happy.  She also states that also made her feel like she was taking a drug.  She states she started taking some old Prozac 10-11 days ago.  She states she graduated from college 1-2 years ago and is currently trying to get a job.  She states she just got married about a year ago.  She states she is having some depression related to their marriage.  When I asked her how she feels now about what she did she states she still feels like she is ready to die.  She states she is never told her family because "they do not believe in that".  She denies any depression in her parents but states she feels like her sister suffers from depression off and on.  Looking at the patient's prescription bottle she had a bottle of Lexapro 10 mg tablets #30 prescribed in 2014, Pyridium No. 10 tablets prescribed in 2016, Prozac 10 mg tablets #60 prescribed in 2014, hydrocodone 5/325 #20 tabs prescribed in 2011,  gabapentin 100 mg #90 prescribed in 2015, and a old bottle of her husband's for clonazepam 1 mg tablets #10 prescribed in 2009.  PCP Deatra James, MD   Past Medical History:  Diagnosis Date  . Anxiety   . Depression     Patient Active Problem List   Diagnosis Date Noted  . Pregnancy, location unknown 05/04/2014  . Paresthesias 10/25/2013  . UNSPECIFIED ESSENTIAL HYPERTENSION 03/12/2010  . TACHYCARDIA 03/12/2010    Past Surgical History:  Procedure Laterality Date  . HYSTEROSCOPY W/D&C N/A 06/01/2014   Procedure: DILATATION AND CURETTAGE WITH FROZEN SECTION, Removal of IUD;  Surgeon: Freddrick March. Tenny Craw, MD;  Location: [facility];  Service: Gynecology;  Laterality: N/A;  WITH FROZEN SECTION  . nose cauterization      OB History    Gravida Para Term Preterm AB Living   1             SAB TAB Ectopic Multiple Live Births                   Home Medications    Prior to Admission medications   Medication Sig Start Date End Date Taking? Authorizing Provider  levonorgestrel (MIRENA) 20 MCG/24HR IUD 1 each by Intrauterine route once.   Yes [provider]  oxyCODONE-acetaminophen (ROXICET) 5-325 MG per tablet Take 2 tablets by mouth every 4 (four) hours as needed. May take 1-2 tablets every 4-6 hours as needed for pain Patient not taking: Reported on 09/02/2017 06/01/14   Waynard Reedsoss, Kendra, MD    Family History Family History  Problem Relation Age of Onset  . Cancer Other   . Thyroid disease Other   . Diabetes Other   . Coronary artery disease Other   . Cancer Maternal Grandmother   . Cancer Maternal Grandfather   . Cancer Paternal Grandmother     Social History Social History   Tobacco Use  . Smoking status: Never Smoker  . Smokeless tobacco: Never Used  Substance Use Topics  . Alcohol use: Yes    Comment: 3 glasses of wine/week  . Drug use: No  lives with spouse   Allergies   Macrobid [nitrofurantoin monohyd macro]   Review of Systems Review of Systems  All  other systems reviewed and are negative.    Physical Exam Updated Vital Signs BP 122/75 (BP Location: Right Arm)   Pulse (!) 146   Temp 98.9 F (37.2 C) (Oral)   Resp 20   Ht 5\' 2"  (1.575 m)   Wt 49 kg (108 lb)   SpO2 100%   BMI 19.75 kg/m   Vital signs normal except for tachycardia   Physical Exam  Constitutional: She is oriented to person, place, and time. She appears well-developed and well-nourished.  Non-toxic appearance. She does not appear ill. No distress.  HENT:  Head: Normocephalic and atraumatic.  Right Ear: External ear normal.  Left Ear: External ear normal.  Nose: Nose normal. No mucosal edema or rhinorrhea.  Mouth/Throat: Oropharynx is clear and moist and mucous membranes are normal. No dental abscesses or uvula swelling.  Eyes: Conjunctivae and EOM are normal. Pupils are equal, round, and reactive to light.  Neck: Normal range of motion and full passive range of motion without pain. Neck supple.  Cardiovascular: Regular rhythm and normal heart sounds. Tachycardia present. Exam reveals no gallop and no friction rub.  No murmur heard. Pulmonary/Chest: Effort normal and breath sounds normal. No respiratory distress. She has no wheezes. She has no rhonchi. She has no rales. She exhibits no tenderness and no crepitus.  Abdominal: Soft. Normal appearance and bowel sounds are normal. She exhibits no distension. There is no tenderness. There is no rebound and no guarding.  Musculoskeletal: Normal range of motion. She exhibits no edema or tenderness.  Moves all extremities well.   Neurological: She is alert and oriented to person, place, and time. She has normal strength. No cranial nerve deficit.  Skin: Skin is warm, dry and intact. No rash noted. No erythema. No pallor.  Psychiatric: Her affect is blunt. Her speech is delayed. She is slowed. She expresses suicidal ideation. She expresses suicidal plans.  tearful  Nursing note and vitals reviewed.    ED Treatments  / Results  Labs (all labs ordered are listed, but only abnormal results are displayed) Results for orders placed or performed during the hospital encounter of 09/02/17  Comprehensive metabolic panel  Result Value Ref Range   Sodium 140 135 - 145 mmol/L   Potassium 3.7 3.5 - 5.1 mmol/L   Chloride 109 101 - 111 mmol/L   CO2 25 22 - 32 mmol/L   Glucose, Bld 95 65 - 99 mg/dL   BUN 6 6 - 20 mg/dL   Creatinine, Ser 1.610.83 0.44 - 1.00 mg/dL   Calcium 9.3 8.9 -  10.3 mg/dL   Total Protein 7.8 6.5 - 8.1 g/dL   Albumin 4.7 3.5 - 5.0 g/dL   AST 23 15 - 41 U/L   ALT 15 14 - 54 U/L   Alkaline Phosphatase 40 38 - 126 U/L   Total Bilirubin 0.5 0.3 - 1.2 mg/dL   GFR calc non Af Amer >60 >60 mL/min   GFR calc Af Amer >60 >60 mL/min   Anion gap 6 5 - 15  Ethanol  Result Value Ref Range   Alcohol, Ethyl (B) 201 (H) <10 mg/dL  Salicylate level  Result Value Ref Range   Salicylate Lvl <7.0 2.8 - 30.0 mg/dL  Acetaminophen level  Result Value Ref Range   Acetaminophen (Tylenol), Serum 36 (H) 10 - 30 ug/mL  cbc  Result Value Ref Range   WBC 9.8 4.0 - 10.5 K/uL   RBC 4.50 3.87 - 5.11 MIL/uL   Hemoglobin 14.2 12.0 - 15.0 g/dL   HCT 16.1 09.6 - 04.5 %   MCV 90.7 78.0 - 100.0 fL   MCH 31.6 26.0 - 34.0 pg   MCHC 34.8 30.0 - 36.0 g/dL   RDW 40.9 81.1 - 91.4 %   Platelets 311 150 - 400 K/uL  I-Stat beta hCG blood, ED  Result Value Ref Range   I-stat hCG, quantitative <5.0 <5 mIU/mL   Comment 3           Laboratory interpretation all normal except alcohol intoxication, elevated acetaminophen level    EKG  EKG Interpretation  Date/Time:  Thursday September 02 2017 05:48:07 EST Ventricular Rate:  148 PR Interval:    QRS Duration: 74 QT Interval:  276 QTC Calculation: 433 R Axis:   67 Text Interpretation:  Sinus tachycardia Consider right atrial enlargement Low voltage, precordial leads Borderline T abnormalities, inferior leads Since last tracing rate faster 28 Feb 2010 Confirmed by Devoria Albe  ([id number]) on 09/02/2017 5:57:08 AM       Radiology No results found.  Procedures .Critical Care Performed by: Devoria Albe, MD Authorized by: Devoria Albe, MD   Critical care provider statement:    Critical care time (minutes):  31   Critical care was necessary to treat or prevent imminent or life-threatening deterioration of the following conditions:  CNS failure or compromise   Critical care was time spent personally by me on the following activities:  Discussions with consultants, evaluation of patient's response to treatment, examination of patient and ordering and review of laboratory studies   (including critical care time)  Medications Ordered in ED Medications  ondansetron (ZOFRAN) injection 4 mg (not administered)  sodium chloride 0.9 % bolus 1,000 mL (0 mLs Intravenous Stopped 09/02/17 0715)  sodium chloride 0.9 % bolus 500 mL (0 mLs Intravenous Stopped 09/02/17 0722)     Initial Impression / Assessment and Plan / ED Course  I have reviewed the triage vital signs and the nursing notes.  Pertinent labs & imaging results that were available during my care of the patient were reviewed by me and considered in my medical decision making (see chart for details).     Patient was given IV fluids and IV nausea is.  Nursing staff contacted poison control.  They state if patient had a significant ingestion of gabapentin she should be admitted for 24-hour observation however the patient states there were not many pills left in her bottle.  4-hour acetaminophen level we will be done at 9 AM, they stated to wait on the acetylcysteine treatment and less  the 4-hour levels over 150.  I have discussed with nursing staff if patient tries to leave IVC papers will need to be done.  08:00 AM Patient turned over to Dr Jeraldine Loots to get results of her 4 hour  acetaminophen level and disposition.   Final Clinical Impressions(s) / ED Diagnoses   Final diagnoses:  Suicidal ideation  Depression,  unspecified depression type    Disposition pending  Devoria Albe, MD, Concha Pyo, MD 09/02/17 [phone]

## 2017-09-02 NOTE — ED Provider Notes (Signed)
Repeat Tylenol level only marginally increased. Patient remains awake alert, appropriate for psychiatric evaluation.   Gerhard MunchLockwood, Mitzy Naron, MD 09/02/17 [phone]

## 2017-09-02 NOTE — [facility] Counselor (Signed)
TTS attempted to assess patient, however, patient continues to be sleep and unable to be awaken. TTS will continue to check on patient an attempt to complete assessment once patient awakes.  

## 2017-09-02 NOTE — ED Notes (Signed)
Report given to TCU.  

## 2017-09-02 NOTE — [facility] Counselor (Signed)
TTS attempted to see patient in an attempt to assess patient. Patient unable to be awaken. Patient sitter also report she attempted to awake patient earlier and patient would not awake.    TTS will be notified when patient can be assessed.

## 2017-09-02 NOTE — ED Notes (Signed)
Pt remains on monitor.  Belinda Avery, spouse, took all of pt's possessions home.

## 2017-09-02 NOTE — H&P (Addendum)
[facility] History and Physical  Cj Beecher Avery ZOX:[id number] DOB: [birth year] DOA: 09/02/2017  Referring physician:  PCP: Deatra James, MD   Chief Complaint: "I wast just angry and not thinking."  HPI: Belinda Avery is a 27 y.o. female past medical history of depression anxiety presents to the emergency room with chief complaint of drug overdose.  Patient also has a history of binge drinking.  Patient's husband states that she occasionally becomes very angry and makes poor choices when she binge drinks.  Last night they got an argument.  She went out and binge drank.  When she got home in the room alone she took several bottles of medication.  She then called her sister.  Her sister then called her husband.  Husband brought patient immediately to the emergency room.  Patient has never had a psychiatric hospitalization.  Patient has never tried self-harm.  ED Course: Due to persistent tachycardia and somnolence. Hospitalist consulted for admit.  Per EDP note: Looking at the patient's prescription bottle she had a bottle of Lexapro 10 mg tablets #30 prescribed in 2014, Pyridium No. 10 tablets prescribed in 2016, Prozac 10 mg tablets #60 prescribed in 2014, hydrocodone 5/325 #20 tabs prescribed in 2011, gabapentin 100 mg #90 prescribed in 2015, and a old bottle of her husband's for clonazepam 1 mg tablets #10 prescribed in 2009.  Some depression related to her marriage.     Review of Systems:  As per HPI otherwise 10 point review of systems negative.    Past Medical History:  Diagnosis Date  . Anxiety   . Depression    Past Surgical History:  Procedure Laterality Date  . HYSTEROSCOPY W/D&C N/A 06/01/2014   Procedure: DILATATION AND CURETTAGE WITH FROZEN SECTION, Removal of IUD;  Surgeon: Freddrick March. Tenny Craw, MD;  Location: [facility];  Service: Gynecology;  Laterality: N/A;  WITH FROZEN SECTION  . nose cauterization     Social History:  reports that  has never smoked. she has  never used smokeless tobacco. She reports that she drinks alcohol. She reports that she does not use drugs.  Allergies  Allergen Reactions  . Macrobid [Nitrofurantoin Monohyd Macro]     Burning in arms  & legs    Family History  Problem Relation Age of Onset  . Cancer Other   . Thyroid disease Other   . Diabetes Other   . Coronary artery disease Other   . Cancer Maternal Grandmother   . Cancer Maternal Grandfather   . Cancer Paternal Grandmother      Prior to Admission medications   Medication Sig Start Date End Date Taking? Authorizing Provider  levonorgestrel (MIRENA) 20 MCG/24HR IUD 1 each by Intrauterine route once.   Yes [provider]  oxyCODONE-acetaminophen (ROXICET) 5-325 MG per tablet Take 2 tablets by mouth every 4 (four) hours as needed. May take 1-2 tablets every 4-6 hours as needed for pain Patient not taking: Reported on 09/02/2017 06/01/14   Waynard Reeds, MD   Physical Exam: Vitals:   09/02/17 0614 09/02/17 0630 09/02/17 1422 09/02/17 1708  BP: (!) 101/57 112/66 (!) 100/56 94/78  Pulse: (!) 144 (!) 147 (!) 136 (!) 134  Resp: 17 14 18 16   Temp:   98.9 F (37.2 C) 98.7 F (37.1 C)  TempSrc:   Oral Oral  SpO2: 96% 96% 96% 95%  Weight:      Height:        Wt Readings from Last 3 Encounters:  09/02/17 49 kg (108  lb)  06/01/14 49 kg (108 lb)  05/10/14 48.6 kg (107 lb 2 oz)    General:  Appears calm and comfortable; A&Ox3, sleepy Eyes:  PERRL, EOMI, normal lids, iris ENT:  grossly normal hearing, lips & tongue Neck:  no LAD, masses or thyromegaly Cardiovascular:  Tachy, RR, no m/r/g. No LE edema.  Respiratory:  CTA bilaterally, no w/r/r. Normal respiratory effort. Abdomen:  soft, ntnd Skin:  no rash or induration seen on limited exam Musculoskeletal:  grossly normal tone BUE/BLE Psychiatric:  grossly normal mood and affect, speech fluent and appropriate Neurologic:  CN 2-12 grossly intact, moves all extremities in coordinated fashion.           Labs on Admission:  Basic Metabolic Panel: Recent Labs  Lab 09/02/17 0530  NA 140  K 3.7  CL 109  CO2 25  GLUCOSE 95  BUN 6  CREATININE 0.83  CALCIUM 9.3   Liver Function Tests: Recent Labs  Lab 09/02/17 0530  AST 23  ALT 15  ALKPHOS 40  BILITOT 0.5  PROT 7.8  ALBUMIN 4.7   No results for input(s): LIPASE, AMYLASE in the last 168 hours. No results for input(s): AMMONIA in the last 168 hours. CBC: Recent Labs  Lab 09/02/17 0530  WBC 9.8  HGB 14.2  HCT 40.8  MCV 90.7  PLT 311   Cardiac Enzymes: No results for input(s): CKTOTAL, CKMB, CKMBINDEX, TROPONINI in the last 168 hours.  BNP (last 3 results) No results for input(s): BNP in the last 8760 hours.  ProBNP (last 3 results) No results for input(s): PROBNP in the last 8760 hours.   Serum creatinine: 0.83 mg/dL 09/81/1901/31/19 [id number] Estimated creatinine clearance: 80.1 mL/min  CBG: No results for input(s): GLUCAP in the last 168 hours.  Radiological Exams on Admission: No results found.  EKG: Independently reviewed. Tachycardia. No STEMI.  Assessment/Plan Active Problems:   Drug overdose   Drug overdose Cardiac monitor Prn narcan Prn flumazenil Sitter BH consult in AM IVF  Code Status: FC  DVT Prophylaxis: lovenox Family Communication: husband at bedside Disposition Plan: Pending Improvement  Status: tele inpt  Haydee SalterPhillip M Jamesetta Greenhalgh, MD Family Medicine [facility] [email or web address] Password [email or web address]

## 2017-09-02 NOTE — ED Provider Notes (Signed)
1700 patient reassessed due to persistent tachycardia.  She had an overdose earlier today of multiple agents.  On assessment she is drowsy but arousable.  She endorses feeling dizzy and has horizontal nystagmus on examination.  She recently had an in and out cath performed for urinary retention.  She states she does feel better after the cath but still feels not right and dizzy.  Given persistent symptoms after 12 hours of poly-ingestion, persistent tachycardia with urinary retention medicine consulted for observation admission.   Tilden Fossaees, Jeffery Gammell, MD 09/03/17 0000
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
## 2017-09-02 NOTE — ED Notes (Signed)
Called poison control back about the elevated acetaminophen level they directed to not treat with mucomyst at this time and draw a new level at 0900 and if it is under 150 we do not have to treat. Lynelle DoctorKnapp, MD notified.

## 2017-09-02 NOTE — ED Notes (Signed)
Pt stated "I still can't pee."  Requested ED Secretary call Admitting, Dr. Melynda RippleHobbs.  Awaiting return call.

## 2017-09-02 NOTE — [facility] Counselor (Signed)
TTS attempted to assess patient. Patient is asleep unable to be awaken. TTS will be notified when patient awakes.

## 2017-09-02 NOTE — ED Triage Notes (Signed)
States about 1 hr pta took medications that she had at home with ETOH per husband alert and oriented x 3 on arrival. Brought in bottles that are old with clonazepam .05mg , phenazopyridine 200mg , hydrodocodone  5mg , gabapentin 100mg , lexapro 10mg , fluoxetine 10mg .  Took unknown amount of each.
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
## 2017-09-02 NOTE — ED Notes (Signed)
TTS assessment in progress. 

## 2017-09-02 NOTE — BHH Counselor (Signed)
Discussed disposition with Consuella LoseElaine, RN.   Redmond Pullingreylese D Aristide Waggle, MS, [facility], [facility] Triage Specialist [phone]

## 2017-09-03 ENCOUNTER — Other Ambulatory Visit: Payer: Self-pay

## 2017-09-03 DIAGNOSIS — R45851 Suicidal ideations: Secondary | ICD-10-CM

## 2017-09-03 DIAGNOSIS — F419 Anxiety disorder, unspecified: Secondary | ICD-10-CM

## 2017-09-03 DIAGNOSIS — T398X2A Poisoning by other nonopioid analgesics and antipyretics, not elsewhere classified, intentional self-harm, initial encounter: Secondary | ICD-10-CM

## 2017-09-03 DIAGNOSIS — T424X2A Poisoning by benzodiazepines, intentional self-harm, initial encounter: Secondary | ICD-10-CM

## 2017-09-03 DIAGNOSIS — T1491XA Suicide attempt, initial encounter: Secondary | ICD-10-CM

## 2017-09-03 DIAGNOSIS — R450 Nervousness: Secondary | ICD-10-CM

## 2017-09-03 DIAGNOSIS — T50902A Poisoning by unspecified drugs, medicaments and biological substances, intentional self-harm, initial encounter: Secondary | ICD-10-CM

## 2017-09-03 DIAGNOSIS — Z975 Presence of (intrauterine) contraceptive device: Secondary | ICD-10-CM

## 2017-09-03 DIAGNOSIS — T426X2A Poisoning by other antiepileptic and sedative-hypnotic drugs, intentional self-harm, initial encounter: Secondary | ICD-10-CM

## 2017-09-03 DIAGNOSIS — T43222A Poisoning by selective serotonin reuptake inhibitors, intentional self-harm, initial encounter: Principal | ICD-10-CM

## 2017-09-03 DIAGNOSIS — F4325 Adjustment disorder with mixed disturbance of emotions and conduct: Secondary | ICD-10-CM

## 2017-09-03 DIAGNOSIS — T402X2A Poisoning by other opioids, intentional self-harm, initial encounter: Secondary | ICD-10-CM

## 2017-09-03 LAB — CBC
HEMATOCRIT: 32.5 % — AB (ref 36.0–46.0)
HEMATOCRIT: 32.5 % — AB (ref 36.0–46.0)
HEMOGLOBIN: 10.8 g/dL — AB (ref 12.0–15.0)
HEMOGLOBIN: 10.9 g/dL — AB (ref 12.0–15.0)
MCH: 30.9 pg (ref 26.0–34.0)
MCH: 31.4 pg (ref 26.0–34.0)
MCHC: 33.2 g/dL (ref 30.0–36.0)
MCHC: 33.5 g/dL (ref 30.0–36.0)
MCV: 92.9 fL (ref 78.0–100.0)
MCV: 93.7 fL (ref 78.0–100.0)
Platelets: 206 10*3/uL (ref 150–400)
Platelets: 210 10*3/uL (ref 150–400)
RBC: 3.47 MIL/uL — ABNORMAL LOW (ref 3.87–5.11)
RBC: 3.5 MIL/uL — ABNORMAL LOW (ref 3.87–5.11)
RDW: 12.7 % (ref 11.5–15.5)
RDW: 12.6 % (ref 11.5–15.5)
WBC: 15.7 10*3/uL — ABNORMAL HIGH (ref 4.0–10.5)
WBC: 17.6 10*3/uL — AB (ref 4.0–10.5)

## 2017-09-03 LAB — COMPREHENSIVE METABOLIC PANEL
ALK PHOS: 30 U/L — AB (ref 38–126)
ALT: 12 U/L — ABNORMAL LOW (ref 14–54)
AST: 17 U/L (ref 15–41)
Albumin: 3.4 g/dL — ABNORMAL LOW (ref 3.5–5.0)
Anion gap: 4 — ABNORMAL LOW (ref 5–15)
BILIRUBIN TOTAL: 0.5 mg/dL (ref 0.3–1.2)
BUN: 6 mg/dL (ref 6–20)
CALCIUM: 8 mg/dL — AB (ref 8.9–10.3)
CO2: 24 mmol/L (ref 22–32)
CREATININE: 0.65 mg/dL (ref 0.44–1.00)
Chloride: 109 mmol/L (ref 101–111)
Glucose, Bld: 85 mg/dL (ref 65–99)
Potassium: 3.3 mmol/L — ABNORMAL LOW (ref 3.5–5.1)
Sodium: 137 mmol/L (ref 135–145)
Total Protein: 5.8 g/dL — ABNORMAL LOW (ref 6.5–8.1)

## 2017-09-03 LAB — CREATININE, SERUM
CREATININE: 0.61 mg/dL (ref 0.44–1.00)
GFR calc Af Amer: >60 mL/min (ref >60)
GFR calc non Af Amer: >60 mL/min (ref >60)

## 2017-09-03 LAB — MAGNESIUM: MAGNESIUM: 1.4 mg/dL — AB (ref 1.7–2.4)

## 2017-09-03 LAB — HIV ANTIBODY (ROUTINE TESTING W REFLEX): HIV Screen 4th Generation wRfx: NONREACTIVE

## 2017-09-03 MED ORDER — POTASSIUM CHLORIDE CRYS ER 20 MEQ PO TBCR
40.0000 meq | EXTENDED_RELEASE_TABLET | Freq: Once | ORAL | Status: AC
  Administered 2017-09-03: 40 meq via ORAL
  Filled 2017-09-03: qty 2

## 2017-09-03 NOTE — ED Notes (Signed)
ED TO INPATIENT HANDOFF REPORT  Name/Age/Gender Belinda Avery A Comito 27 y.o. female  Code Status    Code Status Orders  (From admission, onward)        Start     Ordered   09/02/17 2123  Full code  Continuous     09/02/17 2124    Code Status History    Date Active Date Inactive Code Status Order ID Comments User Context   05/04/2014 17:31 05/05/2014 13:34 Full Code [id number]  Jerelyn Charles, MD Inpatient      Home/SNF/Other Home  Chief Complaint Suicidal; Drug Overdose  Level of Care/Admitting Diagnosis ED Disposition    ED Disposition Condition [place]: [facility] [[id number]]  Level of Care: Telemetry [5]  Admit to tele based on following criteria: Monitor QTC interval  Diagnosis: Drug overdose [[id number]]  Admitting Physician: Elwin Mocha [[id number]]  Attending Physician: Elwin Mocha [[id number]]  Estimated length of stay: past midnight tomorrow  Certification:: I certify this patient will need inpatient services for at least 2 midnights  PT Class (Do Not Modify): Inpatient [101]  PT Acc Code (Do Not Modify): Private [1]       Medical History Past Medical History:  Diagnosis Date  . Anxiety   . Depression     Allergies Allergies  Allergen Reactions  . Macrobid [Nitrofurantoin Monohyd Macro]     Burning in arms  & legs    IV Location/Drains/Wounds Patient Lines/Drains/Airways Status   Active Line/Drains/Airways    Name:   Placement date:   Placement time:   Site:   Days:   Peripheral IV 09/02/17 Left Antecubital   09/02/17    0537    Antecubital   1   Incision (Closed) 06/01/14 Perineum Other (Comment)   06/01/14    0934     1190          Labs/Imaging Results for orders placed or performed during the hospital encounter of 09/02/17 (from the past 48 hour(s))  Comprehensive metabolic panel     Status: None   Collection Time: 09/02/17  5:30 AM  Result Value Ref Range   Sodium 140 135 - 145 mmol/L   Potassium 3.7 3.5 - 5.1 mmol/L   Chloride 109 101 - 111 mmol/L   CO2 25 22 - 32 mmol/L   Glucose, Bld 95 65 - 99 mg/dL   BUN 6 6 - 20 mg/dL   Creatinine, Ser 0.83 0.44 - 1.00 mg/dL   Calcium 9.3 8.9 - 10.3 mg/dL   Total Protein 7.8 6.5 - 8.1 g/dL   Albumin 4.7 3.5 - 5.0 g/dL   AST 23 15 - 41 U/L   ALT 15 14 - 54 U/L   Alkaline Phosphatase 40 38 - 126 U/L   Total Bilirubin 0.5 0.3 - 1.2 mg/dL   GFR calc non Af Amer >60 >60 mL/min   GFR calc Af Amer >60 >60 mL/min    Comment: (NOTE) The eGFR has been calculated using the CKD EPI equation. This calculation has not been validated in all clinical situations. eGFR's persistently <60 mL/min signify possible Chronic Kidney Disease.    Anion gap 6 5 - 15  Ethanol     Status: Abnormal   Collection Time: 09/02/17  5:30 AM  Result Value Ref Range   Alcohol, Ethyl (B) 201 (H) <10 mg/dL    Comment:        LOWEST DETECTABLE LIMIT FOR SERUM ALCOHOL IS 10 mg/dL FOR MEDICAL  PURPOSES ONLY   Salicylate level     Status: None   Collection Time: 09/02/17  5:30 AM  Result Value Ref Range   Salicylate Lvl <1.4 2.8 - 30.0 mg/dL  Acetaminophen level     Status: Abnormal   Collection Time: 09/02/17  5:30 AM  Result Value Ref Range   Acetaminophen (Tylenol), Serum 36 (H) 10 - 30 ug/mL    Comment:        THERAPEUTIC CONCENTRATIONS VARY SIGNIFICANTLY. A RANGE OF 10-30 ug/mL MAY BE AN EFFECTIVE CONCENTRATION FOR MANY PATIENTS. HOWEVER, SOME ARE BEST TREATED AT CONCENTRATIONS OUTSIDE THIS RANGE. ACETAMINOPHEN CONCENTRATIONS >150 ug/mL AT 4 HOURS AFTER INGESTION AND >50 ug/mL AT 12 HOURS AFTER INGESTION ARE OFTEN ASSOCIATED WITH TOXIC REACTIONS.   cbc     Status: None   Collection Time: 09/02/17  5:30 AM  Result Value Ref Range   WBC 9.8 4.0 - 10.5 K/uL   RBC 4.50 3.87 - 5.11 MIL/uL   Hemoglobin 14.2 12.0 - 15.0 g/dL   HCT 40.8 36.0 - 46.0 %   MCV 90.7 78.0 - 100.0 fL   MCH 31.6 26.0 - 34.0 pg   MCHC 34.8 30.0 - 36.0 g/dL   RDW 12.2  11.5 - 15.5 %   Platelets 311 150 - 400 K/uL  I-Stat beta hCG blood, ED     Status: None   Collection Time: 09/02/17  5:38 AM  Result Value Ref Range   I-stat hCG, quantitative <5.0 <5 mIU/mL   Comment 3            Comment:   GEST. AGE      CONC.  (mIU/mL)   <=1 WEEK        5 - 50     2 WEEKS       50 - 500     3 WEEKS       100 - 10,000     4 WEEKS     1,000 - 30,000        FEMALE AND NON-PREGNANT FEMALE:     LESS THAN 5 mIU/mL   Acetaminophen level     Status: Abnormal   Collection Time: 09/02/17  8:35 AM  Result Value Ref Range   Acetaminophen (Tylenol), Serum 43 (H) 10 - 30 ug/mL    Comment:        THERAPEUTIC CONCENTRATIONS VARY SIGNIFICANTLY. A RANGE OF 10-30 ug/mL MAY BE AN EFFECTIVE CONCENTRATION FOR MANY PATIENTS. HOWEVER, SOME ARE BEST TREATED AT CONCENTRATIONS OUTSIDE THIS RANGE. ACETAMINOPHEN CONCENTRATIONS >150 ug/mL AT 4 HOURS AFTER INGESTION AND >50 ug/mL AT 12 HOURS AFTER INGESTION ARE OFTEN ASSOCIATED WITH TOXIC REACTIONS.   Rapid urine drug screen (hospital performed)     Status: Abnormal   Collection Time: 09/02/17  3:00 PM  Result Value Ref Range   Opiates POSITIVE (A) NONE DETECTED   Cocaine NONE DETECTED NONE DETECTED   Benzodiazepines NONE DETECTED NONE DETECTED   Amphetamines NONE DETECTED NONE DETECTED   Tetrahydrocannabinol NONE DETECTED NONE DETECTED   Barbiturates NONE DETECTED NONE DETECTED    Comment: (NOTE) DRUG SCREEN FOR MEDICAL PURPOSES ONLY.  IF CONFIRMATION IS NEEDED FOR ANY PURPOSE, NOTIFY LAB WITHIN 5 DAYS. LOWEST DETECTABLE LIMITS FOR URINE DRUG SCREEN Drug Class                     Cutoff (ng/mL) Amphetamine and metabolites    1000 Barbiturate and metabolites    200 Benzodiazepine  742 Tricyclics and metabolites     300 Opiates and metabolites        300 Cocaine and metabolites        300 THC                            50   Urinalysis, Routine w reflex microscopic     Status: Abnormal   Collection Time:  09/02/17  3:01 PM  Result Value Ref Range   Color, Urine AMBER (A) YELLOW    Comment: BIOCHEMICALS MAY BE AFFECTED BY COLOR   APPearance CLEAR CLEAR   Specific Gravity, Urine 1.005 1.005 - 1.030   pH 6.0 5.0 - 8.0   Glucose, UA NEGATIVE NEGATIVE mg/dL   Hgb urine dipstick MODERATE (A) NEGATIVE   Bilirubin Urine NEGATIVE NEGATIVE   Ketones, ur NEGATIVE NEGATIVE mg/dL   Protein, ur NEGATIVE NEGATIVE mg/dL   Nitrite POSITIVE (A) NEGATIVE   Leukocytes, UA NEGATIVE NEGATIVE   RBC / HPF 0-5 0 - 5 RBC/hpf   WBC, UA 0-5 0 - 5 WBC/hpf   Bacteria, UA NONE SEEN NONE SEEN   Squamous Epithelial / LPF NONE SEEN NONE SEEN   Mucus PRESENT   Acetaminophen level     Status: None   Collection Time: 09/02/17  8:00 PM  Result Value Ref Range   Acetaminophen (Tylenol), Serum 20 10 - 30 ug/mL    Comment:        THERAPEUTIC CONCENTRATIONS VARY SIGNIFICANTLY. A RANGE OF 10-30 ug/mL MAY BE AN EFFECTIVE CONCENTRATION FOR MANY PATIENTS. HOWEVER, SOME ARE BEST TREATED AT CONCENTRATIONS OUTSIDE THIS RANGE. ACETAMINOPHEN CONCENTRATIONS >150 ug/mL AT 4 HOURS AFTER INGESTION AND >50 ug/mL AT 12 HOURS AFTER INGESTION ARE OFTEN ASSOCIATED WITH TOXIC REACTIONS.   Comprehensive metabolic panel     Status: Abnormal   Collection Time: 09/02/17  8:00 PM  Result Value Ref Range   Sodium 139 135 - 145 mmol/L   Potassium 3.7 3.5 - 5.1 mmol/L   Chloride 110 101 - 111 mmol/L   CO2 25 22 - 32 mmol/L   Glucose, Bld 107 (H) 65 - 99 mg/dL   BUN 7 6 - 20 mg/dL   Creatinine, Ser 0.66 0.44 - 1.00 mg/dL   Calcium 7.9 (L) 8.9 - 10.3 mg/dL   Total Protein 6.2 (L) 6.5 - 8.1 g/dL   Albumin 3.6 3.5 - 5.0 g/dL   AST 18 15 - 41 U/L   ALT 11 (L) 14 - 54 U/L   Alkaline Phosphatase 34 (L) 38 - 126 U/L   Total Bilirubin 0.3 0.3 - 1.2 mg/dL   GFR calc non Af Amer >60 >60 mL/min   GFR calc Af Amer >60 >60 mL/min    Comment: (NOTE) The eGFR has been calculated using the CKD EPI equation. This calculation has not been  validated in all clinical situations. eGFR's persistently <60 mL/min signify possible Chronic Kidney Disease.    Anion gap 4 (L) 5 - 15  CBC     Status: Abnormal   Collection Time: 09/03/17 12:34 AM  Result Value Ref Range   WBC 15.7 (H) 4.0 - 10.5 K/uL   RBC 3.47 (L) 3.87 - 5.11 MIL/uL   Hemoglobin 10.9 (L) 12.0 - 15.0 g/dL    Comment: DELTA CHECK NOTED REPEATED TO VERIFY    HCT 32.5 (L) 36.0 - 46.0 %   MCV 93.7 78.0 - 100.0 fL   MCH 31.4 26.0 - 34.0 pg  MCHC 33.5 30.0 - 36.0 g/dL   RDW 12.7 11.5 - 15.5 %   Platelets 206 150 - 400 K/uL  Creatinine, serum     Status: None   Collection Time: 09/03/17 12:34 AM  Result Value Ref Range   Creatinine, Ser 0.61 0.44 - 1.00 mg/dL   GFR calc non Af Amer >60 >60 mL/min   GFR calc Af Amer >60 >60 mL/min    Comment: (NOTE) The eGFR has been calculated using the CKD EPI equation. This calculation has not been validated in all clinical situations. eGFR's persistently <60 mL/min signify possible Chronic Kidney Disease.    No results found.  Pending Labs Unresulted Labs (From admission, onward)   Start     Ordered   09/09/17 0500  Creatinine, serum  (enoxaparin (LOVENOX)    CrCl >/= 30 ml/min)  Weekly,   R    Comments:  while on enoxaparin therapy    09/02/17 2124   09/03/17 0500  CBC  Tomorrow morning,   R     09/02/17 2124   09/03/17 0500  Comprehensive metabolic panel  Tomorrow morning,   R     09/02/17 2124   09/02/17 2123  HIV antibody (Routine Testing)  Once,   R     09/02/17 2124      Vitals/Pain Today's Vitals   09/03/17 0100 09/03/17 0130 09/03/17 0200 09/03/17 0230  BP: 103/74 102/76 102/77 103/77  Pulse: 92 92 89 89  Resp: '16 17 17 17  ' Temp:      TempSrc:      SpO2: 96% 98% 98% 98%  Weight:      Height:      PainSc:        Isolation Precautions No active isolations  Medications Medications  ondansetron (ZOFRAN) injection 4 mg (4 mg Intravenous Not Given 09/02/17 1208)  0.9 %  sodium chloride infusion  (1,000 mLs Intravenous New Bag/Given 09/02/17 1959)  naloxone (NARCAN) injection 0.4 mg (not administered)  flumazenil (ROMAZICON) injection 0.3 mg (not administered)  enoxaparin (LOVENOX) injection 40 mg (40 mg Subcutaneous Given 09/02/17 2224)  ibuprofen (ADVIL,MOTRIN) tablet 400 mg (not administered)  ondansetron (ZOFRAN) tablet 4 mg (not administered)    Or  ondansetron (ZOFRAN) injection 4 mg (not administered)  hydrALAZINE (APRESOLINE) injection 10 mg (not administered)  sodium chloride 0.9 % bolus 1,000 mL (0 mLs Intravenous Stopped 09/02/17 0715)  sodium chloride 0.9 % bolus 500 mL (0 mLs Intravenous Stopped 09/02/17 0722)  sodium chloride 0.9 % bolus 1,000 mL (0 mLs Intravenous Stopped 09/02/17 1944)    Mobility walks

## 2017-09-03 NOTE — Progress Notes (Signed)
LCSW consulted for intentional OD.   LCSW awaiting psych note to confirm plan.   LCSW will continue to follow.   Belinda GandyBernette Jaclin Finks, LSCW [facility] CSW [phone]

## 2017-09-03 NOTE — Progress Notes (Signed)
PROGRESS NOTE        PATIENT DETAILS Name: Belinda Avery Age: 27 y.o. Sex: female Date of Birth: 08-24-1990 Admit Date: 09/02/2017 Admitting Physician Haydee SalterPhillip M Hobbs, MD PCP:Sun, Charise CarwinVyvyan, MD  Brief Narrative: Patient is a 27 y.o. female history of depression/anxiety-admitted with overdose of multiple agents including Lexapro, Pyridium, Prozac, acetaminophen-hydrocodone, gabapentin, clonazepam.  She was evaluated in the emergency room on 1/31-provided supportive care-earlier on reevaluation-and subsequently admitted to the hospitalist service.  See below for further details  Subjective: Lying comfortably in bed-heart rate has improved significantly.  Briefly developed urinary retention-but that has now Lannette Donathresolved-she is now voiding.  Father at bedside-I spoke with him after patient gave me permission.  Husband was sleeping and did not wake up during my evaluation.  Assessment/Plan: Suicidal attempt secondary to drug overdose: She seems to have stabilized-telemetry without any major arrhythmias.  Have consulted psychiatry.  Continue 1 is to 1 sitter.   Depression: Acknowledges long-standing history of depression-claims that she had [facility] and was started on multiple medications that she did not take.  These medications were still with her-and these of the medication she overdosed on.  Sinus tachycardia: Improved-continue telemetry monitoring   Leukocytosis: No indication of infection-continue to monitor of antimicrobial therapy  DVT Prophylaxis: Prophylactic Lovenox   Code Status: Full code  Family Communication: Father at bedside  Disposition Plan: Remain inpatient-await psych eval  Antimicrobial agents: Anti-infectives (From admission, onward)   None      Procedures: None  CONSULTS:  psychiatry  Time spent: 25- minutes-Greater than 50% of this time was spent in counseling,  explanation of diagnosis, planning of further management, and coordination of care.  MEDICATIONS: Scheduled Meds: . enoxaparin (LOVENOX) injection  40 mg Subcutaneous Q24H  . ondansetron (ZOFRAN) IV  4 mg Intravenous Once  . potassium chloride  40 mEq Oral Once   Continuous Infusions: . sodium chloride 1,000 mL (09/02/17 1959)   PRN Meds:.flumazenil, hydrALAZINE, ibuprofen, naLOXone (NARCAN)  injection, ondansetron **OR** ondansetron (ZOFRAN) IV   PHYSICAL EXAM: Vital signs: Vitals:   09/03/17 0230 09/03/17 0300 09/03/17 0314 09/03/17 0615  BP: 103/77 106/80 106/80 109/81  Pulse: 89 93 (!) 103 98  Resp: 17 19 18 18   Temp:   99.3 F (37.4 C) 99.2 F (37.3 C)  TempSrc:   Oral Oral  SpO2: 98% 97% 98% 96%  Weight:    49.8 kg (109 lb 12.6 oz)  Height:       Filed Weights   09/02/17 0520 09/03/17 0615  Weight: 49 kg (108 lb) 49.8 kg (109 lb 12.6 oz)   Body mass index is 20.08 kg/m.   General appearance :Awake, alert, not in any distress.  Eyes:, pupils equally reactive to light and accomodationa HEENT: Atraumatic and Normocephalic Neck: supple, no JVD. Resp:Good air entry bilaterally, no added sounds  CVS: S1 S2 regular, no murmurs.  GI: Bowel sounds present, Non tender and not distended with no gaurding, rigidity or rebound.No organomegaly Extremities: B/L Lower Ext shows no edema, both legs are warm to touch Neurology:  speech clear,Non focal, sensation is grossly intact. Psychiatric: Normal judgment and insight. Alert and oriented x 3. Normal mood. Musculoskeletal:No digital cyanosis Skin:No Rash, warm and dry Wounds:N/A  I have personally reviewed following labs and imaging studies  LABORATORY DATA: CBC:  Recent Labs  Lab 09/02/17 0530 09/03/17 0034 09/03/17 0550  WBC 9.8 15.7* 17.6*  HGB 14.2 10.9* 10.8*  HCT 40.8 32.5* 32.5*  MCV 90.7 93.7 92.9  PLT 311 206 210    Basic Metabolic Panel: Recent Labs  Lab 09/02/17 0530 09/02/17 2000 09/03/17 0034  09/03/17 0550  NA 140 139  --  137  K 3.7 3.7  --  3.3*  CL 109 110  --  109  CO2 25 25  --  24  GLUCOSE 95 107*  --  85  BUN 6 7  --  6  CREATININE 0.83 0.66 0.61 0.65  CALCIUM 9.3 7.9*  --  8.0*    GFR: Estimated Creatinine Clearance: 84.5 mL/min (by C-G formula based on SCr of 0.65 mg/dL).  Liver Function Tests: Recent Labs  Lab 09/02/17 0530 09/02/17 2000 09/03/17 0550  AST 23 18 17   ALT 15 11* 12*  ALKPHOS 40 34* 30*  BILITOT 0.5 0.3 0.5  PROT 7.8 6.2* 5.8*  ALBUMIN 4.7 3.6 3.4*   No results for input(s): LIPASE, AMYLASE in the last 168 hours. No results for input(s): AMMONIA in the last 168 hours.  Coagulation Profile: No results for input(s): INR, PROTIME in the last 168 hours.  Cardiac Enzymes: No results for input(s): CKTOTAL, CKMB, CKMBINDEX, TROPONINI in the last 168 hours.  BNP (last 3 results) No results for input(s): PROBNP in the last 8760 hours.  HbA1C: No results for input(s): HGBA1C in the last 72 hours.  CBG: No results for input(s): GLUCAP in the last 168 hours.  Lipid Profile: No results for input(s): CHOL, HDL, LDLCALC, TRIG, CHOLHDL, LDLDIRECT in the last 72 hours.  Thyroid Function Tests: No results for input(s): TSH, T4TOTAL, FREET4, T3FREE, THYROIDAB in the last 72 hours.  Anemia Panel: No results for input(s): VITAMINB12, FOLATE, FERRITIN, TIBC, IRON, RETICCTPCT in the last 72 hours.  Urine analysis:    Component Value Date/Time   COLORURINE AMBER (A) 09/02/2017 1501   APPEARANCEUR CLEAR 09/02/2017 1501   LABSPEC 1.005 09/02/2017 1501   PHURINE 6.0 09/02/2017 1501   GLUCOSEU NEGATIVE 09/02/2017 1501   HGBUR MODERATE (A) 09/02/2017 1501   BILIRUBINUR NEGATIVE 09/02/2017 1501   BILIRUBINUR neg 10/28/2013 1716   KETONESUR NEGATIVE 09/02/2017 1501   PROTEINUR NEGATIVE 09/02/2017 1501   UROBILINOGEN 0.2 04/24/2014 1954   NITRITE POSITIVE (A) 09/02/2017 1501   LEUKOCYTESUR NEGATIVE 09/02/2017 1501    Sepsis Labs: Lactic  Acid, Venous No results found for: LATICACIDVEN  MICROBIOLOGY: No results found for this or any previous visit (from the past 240 hour(s)).  RADIOLOGY STUDIES/RESULTS: No results found.   LOS: 1 day   Jeoffrey Massed, MD  [facility] Pager:[phone]  If 7PM-7AM, please contact night-coverage [email or web address] Password [facility] 09/03/2017, 11:43 AM

## 2017-09-03 NOTE — Progress Notes (Signed)
   09/03/17 1400  Clinical Encounter Type  Visited With Patient and family together;Health care provider  Visit Type Initial;Spiritual support  Referral From Nurse  Consult/Referral To Chaplain   Following up on a SCC.  Spoke with healthcare provider before entering.  Patient had 3 family members at bedside and a sister.  Patient indicated she was doing much better.  Family was in a deep conversation.  Asked if they needed anything and they indicated better today and were ok for now.  Will follow as needed. Chaplain Agustin CreeNewton Maximilian Tallo
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
## 2017-09-03 NOTE — Progress Notes (Signed)
Patient informed this RN that she is willing to go to treatment voluntarily.

## 2017-09-03 NOTE — Progress Notes (Addendum)
Patient c/o IV being painful. Removed IV. Patient to shower. Sitter with patient. MD made aware of IV removal. Patient c/o pain when "only when breathing in" to upper bilateral chest. Lungs clear. Will continue to monitor.

## 2017-09-04 ENCOUNTER — Encounter (HOSPITAL_COMMUNITY): Payer: Self-pay

## 2017-09-04 ENCOUNTER — Other Ambulatory Visit: Payer: Self-pay

## 2017-09-04 ENCOUNTER — Inpatient Hospital Stay (HOSPITAL_COMMUNITY)
Admission: AD | Admit: 2017-09-04 | Discharge: 2017-09-07 | DRG: 885 | Disposition: A | Discharge status: Home / Self Care | Payer: 59 | Source: Intra-hospital | Attending: Psychiatry | Admitting: Psychiatry

## 2017-09-04 DIAGNOSIS — T43222A Poisoning by selective serotonin reuptake inhibitors, intentional self-harm, initial encounter: Secondary | ICD-10-CM | POA: Diagnosis not present

## 2017-09-04 DIAGNOSIS — R443 Hallucinations, unspecified: Secondary | ICD-10-CM | POA: Diagnosis not present

## 2017-09-04 DIAGNOSIS — F1099 Alcohol use, unspecified with unspecified alcohol-induced disorder: Secondary | ICD-10-CM | POA: Diagnosis not present

## 2017-09-04 DIAGNOSIS — Z598 Other problems related to housing and economic circumstances: Secondary | ICD-10-CM | POA: Diagnosis not present

## 2017-09-04 DIAGNOSIS — Z6379 Other stressful life events affecting family and household: Secondary | ICD-10-CM | POA: Diagnosis not present

## 2017-09-04 DIAGNOSIS — R45851 Suicidal ideations: Secondary | ICD-10-CM | POA: Diagnosis not present

## 2017-09-04 DIAGNOSIS — T398X2A Poisoning by other nonopioid analgesics and antipyretics, not elsewhere classified, intentional self-harm, initial encounter: Secondary | ICD-10-CM | POA: Diagnosis not present

## 2017-09-04 DIAGNOSIS — Z975 Presence of (intrauterine) contraceptive device: Secondary | ICD-10-CM | POA: Diagnosis not present

## 2017-09-04 DIAGNOSIS — T424X2A Poisoning by benzodiazepines, intentional self-harm, initial encounter: Secondary | ICD-10-CM | POA: Diagnosis not present

## 2017-09-04 DIAGNOSIS — T1491XA Suicide attempt, initial encounter: Secondary | ICD-10-CM | POA: Diagnosis not present

## 2017-09-04 DIAGNOSIS — Z630 Problems in relationship with spouse or partner: Secondary | ICD-10-CM | POA: Diagnosis not present

## 2017-09-04 DIAGNOSIS — Y907 Blood alcohol level of 200-239 mg/100 ml: Secondary | ICD-10-CM | POA: Diagnosis not present

## 2017-09-04 DIAGNOSIS — F10129 Alcohol abuse with intoxication, unspecified: Secondary | ICD-10-CM | POA: Diagnosis not present

## 2017-09-04 DIAGNOSIS — F332 Major depressive disorder, recurrent severe without psychotic features: Secondary | ICD-10-CM | POA: Diagnosis present

## 2017-09-04 DIAGNOSIS — Z915 Personal history of self-harm: Secondary | ICD-10-CM | POA: Diagnosis not present

## 2017-09-04 DIAGNOSIS — T426X2A Poisoning by other antiepileptic and sedative-hypnotic drugs, intentional self-harm, initial encounter: Secondary | ICD-10-CM | POA: Diagnosis not present

## 2017-09-04 DIAGNOSIS — T50902A Poisoning by unspecified drugs, medicaments and biological substances, intentional self-harm, initial encounter: Secondary | ICD-10-CM | POA: Diagnosis not present

## 2017-09-04 DIAGNOSIS — F419 Anxiety disorder, unspecified: Secondary | ICD-10-CM | POA: Diagnosis not present

## 2017-09-04 DIAGNOSIS — Z566 Other physical and mental strain related to work: Secondary | ICD-10-CM | POA: Diagnosis not present

## 2017-09-04 DIAGNOSIS — T402X2A Poisoning by other opioids, intentional self-harm, initial encounter: Secondary | ICD-10-CM | POA: Diagnosis not present

## 2017-09-04 LAB — BASIC METABOLIC PANEL
ANION GAP: 7 (ref 5–15)
BUN: 12 mg/dL (ref 6–20)
CHLORIDE: 106 mmol/L (ref 101–111)
CO2: 24 mmol/L (ref 22–32)
Calcium: 8.9 mg/dL (ref 8.9–10.3)
Creatinine, Ser: 0.74 mg/dL (ref 0.44–1.00)
Glucose, Bld: 87 mg/dL (ref 65–99)
POTASSIUM: 3.6 mmol/L (ref 3.5–5.1)
SODIUM: 137 mmol/L (ref 135–145)

## 2017-09-04 LAB — CBC
HCT: 34.8 % — ABNORMAL LOW (ref 36.0–46.0)
Hemoglobin: 11.9 g/dL — ABNORMAL LOW (ref 12.0–15.0)
MCH: 30.9 pg (ref 26.0–34.0)
MCHC: 34.2 g/dL (ref 30.0–36.0)
MCV: 90.4 fL (ref 78.0–100.0)
PLATELETS: 256 10*3/uL (ref 150–400)
RBC: 3.85 MIL/uL — AB (ref 3.87–5.11)
RDW: 12.3 % (ref 11.5–15.5)
WBC: 12.6 10*3/uL — AB (ref 4.0–10.5)

## 2017-09-04 MED ORDER — ALUM & MAG HYDROXIDE-SIMETH 200-200-20 MG/5ML PO SUSP: 30.0000 mL | ORAL | Status: DC | PRN

## 2017-09-04 MED ORDER — MAGNESIUM HYDROXIDE 400 MG/5ML PO SUSP: 30.0000 mL | Freq: Every day | ORAL | Status: DC | PRN

## 2017-09-04 MED ORDER — VITAMINS A & D EX OINT
TOPICAL_OINTMENT | CUTANEOUS | Status: AC
  Administered 2017-09-04: 17:00:00
  Filled 2017-09-04: qty 5

## 2017-09-04 MED ORDER — IBUPROFEN 400 MG PO TABS: 400.0000 mg | ORAL_TABLET | Freq: Four times a day (QID) | ORAL | 0 refills | Status: DC | PRN

## 2017-09-04 MED ORDER — ONDANSETRON HCL 4 MG PO TABS: 4.0000 mg | ORAL_TABLET | Freq: Four times a day (QID) | ORAL | 0 refills | Status: DC | PRN

## 2017-09-04 NOTE — Discharge Summary (Signed)
PATIENT DETAILS Name: Belinda Avery Age: 27 y.o. Sex: female Date of Birth: [birth year] MRN: [id number]. Admitting Physician: Belinda SalterPhillip M Hobbs, MD PCP:Belinda Avery, Belinda CarwinVyvyan, MD  Admit Date: 09/02/2017 Discharge date: 09/04/2017  Recommendations for Outpatient Follow-up:  1. Follow up with PCP in 1-2 weeks  Admitted From:  Home  Disposition: Inpatient psychiatry   Home Health:  No  Equipment/Devices: None  Discharge Condition: Stable  CODE STATUS: FULL CODE  Diet recommendation:  Regular  Brief Summary: See H&P, Labs, Consult and Test reports for all details in brief,Patient is a 27 y.o. female history of depression/anxiety-admitted with overdose of multiple agents including Lexapro, Pyridium, Prozac, acetaminophen-hydrocodone, gabapentin, clonazepam.  She was evaluated in the emergency room on 1/31-provided supportive care-earlier on reevaluation-and subsequently admitted to the hospitalist service.  See below for further details  Brief Hospital Course: Suicidal attempt secondary to drug overdose: She seems to have stabilized-telemetry without any major arrhythmias.  Seen by psychiatry-sitter at bedside-she has voluntarily agreed to be transferred to inpatient psychiatry for further stabilization.  She is medically stable at this time to be transferred to inpatient psychiatry when a bed is available.  Continue with a one-to-one sitter in the meantime.  Depression: Acknowledges long-standing history of depression-claims that she had [facility] and was started on multiple medications that she did not take.  These medications were still with her-and these of the medication she overdosed on.  Sinus tachycardia:  Resolved  Leukocytosis:  Has almost normalized-no indication of infection-continue to monitor off antimicrobial therapy.    Procedures/Studies: None  Discharge Diagnoses:  Principal Problem:   Adjustment  disorder with mixed disturbance of emotions and conduct Active Problems:   Drug overdose   Discharge Instructions:  Activity:  As tolerated   Discharge Instructions    Diet general   Complete by:  As directed    Discharge instructions   Complete by:  As directed    Follow with Primary MD  Belinda JamesSun, Vyvyan, MD in 1 week  Please get a complete blood count and chemistry panel checked by your Primary MD at your next visit, and again as instructed by your Primary MD.  Get Medicines reviewed and adjusted: Please take all your medications with you for your next visit with your Primary MD  Laboratory/radiological data: Please request your Primary MD to go over all hospital tests and procedure/radiological results at the follow up, please ask your Primary MD to get all Hospital records sent to his/her office.  In some cases, they will be blood work, cultures and biopsy results pending at the time of your discharge. Please request that your primary care M.D. follows up on these results.  Also Note the following: If you experience worsening of your admission symptoms, develop shortness of breath, life threatening emergency, suicidal or homicidal thoughts you must seek medical attention immediately by calling 911 or calling your MD immediately  if symptoms less severe.  You must read complete instructions/literature along with all the possible adverse reactions/side effects for all the Medicines you take and that have been prescribed to you. Take any new Medicines after you have completely understood and accpet all the possible adverse reactions/side effects.   Do not drive when taking Pain medications or sleeping medications (Benzodaizepines)  Do not take more than prescribed Pain, Sleep and Anxiety Medications. It is not advisable to combine anxiety,sleep and pain medications without talking with your primary care practitioner  Special Instructions: If  you have smoked or chewed Tobacco  in the  last 2 yrs please stop smoking, stop any regular Alcohol  and or any Recreational drug use.  Wear Seat belts while driving.  Please note: You were cared for by a hospitalist during your hospital stay. Once you are discharged, your primary care physician will handle any further medical issues. Please note that NO REFILLS for any discharge medications will be authorized once you are discharged, as it is imperative that you return to your primary care physician (or establish a relationship with a primary care physician if you do not have one) for your post hospital discharge needs so that they can reassess your need for medications and monitor your lab values.   Increase activity slowly   Complete by:  As directed      Allergies as of 09/04/2017      Reactions   Macrobid [nitrofurantoin Monohyd Macro]    Burning in arms  & legs      Medication List    STOP taking these medications   levonorgestrel 20 MCG/24HR IUD Commonly known as:  MIRENA   oxyCODONE-acetaminophen 5-325 MG tablet Commonly known as:  ROXICET     TAKE these medications   ibuprofen 400 MG tablet Commonly known as:  ADVIL,MOTRIN Take 1 tablet (400 mg total) by mouth every 6 (six) hours as needed for fever, headache, mild pain or moderate pain.   ondansetron 4 MG tablet Commonly known as:  ZOFRAN Take 1 tablet (4 mg total) by mouth every 6 (six) hours as needed for nausea.      Follow-up Information    Belinda James, MD. Schedule an appointment as soon as possible for a visit in 1 week(s).   Specialty:  Family Medicine Contact information: [address], [address] [place] [address] [phone]          Allergies  Allergen Reactions  . Macrobid [Nitrofurantoin Monohyd Macro]     Burning in arms  & legs    Consultations:   psychiatry  Other Procedures/Studies:  No results found.   TODAY-DAY OF DISCHARGE:  Subjective:   Belinda Avery today has no headache,no chest abdominal pain,no  new weakness tingling or numbness  Objective:   Blood pressure 96/60, pulse 88, temperature 98 F (36.7 C), temperature source Oral, resp. rate 18, height 5\' 2"  (1.575 m), weight 47.4 kg (104 lb 8 oz), SpO2 98 %, unknown if currently breastfeeding.  Intake/Output Summary (Last 24 hours) at 09/04/2017 1102 Last data filed at 09/03/2017 1800 Gross per 24 hour  Intake 360 ml  Output -  Net 360 ml   Filed Weights   09/02/17 0520 09/03/17 0615 09/04/17 0500  Weight: 49 kg (108 lb) 49.8 kg (109 lb 12.6 oz) 47.4 kg (104 lb 8 oz)    Exam: Awake Alert, Oriented *3, No new F.N deficits,  [place].AT,PERRAL Supple Neck,No JVD, No cervical lymphadenopathy appriciated.  Symmetrical Chest wall movement, Good air movement bilaterally, CTAB RRR,No Gallops,Rubs or new Murmurs, No Parasternal Heave +ve B.Sounds, Abd Soft, Non tender, No organomegaly appriciated, No rebound -guarding or rigidity. No Cyanosis, Clubbing or edema, No new Rash or bruise   PERTINENT RADIOLOGIC STUDIES: No results found.   PERTINENT LAB RESULTS: CBC: Recent Labs    09/03/17 0550 09/04/17 0638  WBC 17.6* 12.6*  HGB 10.8* 11.9*  HCT 32.5* 34.8*  PLT 210 256   CMET CMP     Component Value Date/Time   NA 137 09/04/2017 8099  K 3.6 09/04/2017 0638   CL 106 09/04/2017 0638   CO2 24 09/04/2017 0638   GLUCOSE 87 09/04/2017 0638   BUN 12 09/04/2017 0638   CREATININE 0.74 09/04/2017 0638   CALCIUM 8.9 09/04/2017 0638   PROT 5.8 (L) 09/03/2017 0550   ALBUMIN 3.4 (L) 09/03/2017 0550   AST 17 09/03/2017 0550   ALT 12 (L) 09/03/2017 0550   ALKPHOS 30 (L) 09/03/2017 0550   BILITOT 0.5 09/03/2017 0550   GFRNONAA >60 09/04/2017 0638   GFRAA >60 09/04/2017 0638    GFR Estimated Creatinine Clearance: 80.4 mL/min (by C-G formula based on SCr of 0.74 mg/dL). No results for input(s): LIPASE, AMYLASE in the last 72 hours. No results for input(s): CKTOTAL, CKMB, CKMBINDEX, TROPONINI in the last 72 hours. Invalid  input(s): POCBNP No results for input(s): DDIMER in the last 72 hours. No results for input(s): HGBA1C in the last 72 hours. No results for input(s): CHOL, HDL, LDLCALC, TRIG, CHOLHDL, LDLDIRECT in the last 72 hours. No results for input(s): TSH, T4TOTAL, T3FREE, THYROIDAB in the last 72 hours.  Invalid input(s): FREET3 No results for input(s): VITAMINB12, FOLATE, FERRITIN, TIBC, IRON, RETICCTPCT in the last 72 hours. Coags: No results for input(s): INR in the last 72 hours.  Invalid input(s): PT Microbiology: No results found for this or any previous visit (from the past 240 hour(s)).  FURTHER DISCHARGE INSTRUCTIONS:  Get Medicines reviewed and adjusted: Please take all your medications with you for your next visit with your Primary MD  Laboratory/radiological data: Please request your Primary MD to go over all hospital tests and procedure/radiological results at the follow up, please ask your Primary MD to get all Hospital records sent to his/her office.  In some cases, they will be blood work, cultures and biopsy results pending at the time of your discharge. Please request that your primary care M.D. goes through all the records of your hospital data and follows up on these results.  Also Note the following: If you experience worsening of your admission symptoms, develop shortness of breath, life threatening emergency, suicidal or homicidal thoughts you must seek medical attention immediately by calling 911 or calling your MD immediately  if symptoms less severe.  You must read complete instructions/literature along with all the possible adverse reactions/side effects for all the Medicines you take and that have been prescribed to you. Take any new Medicines after you have completely understood and accpet all the possible adverse reactions/side effects.   Do not drive when taking Pain medications or sleeping medications (Benzodaizepines)  Do not take more than prescribed Pain,  Sleep and Anxiety Medications. It is not advisable to combine anxiety,sleep and pain medications without talking with your primary care practitioner  Special Instructions: If you have smoked or chewed Tobacco  in the last 2 yrs please stop smoking, stop any regular Alcohol  and or any Recreational drug use.  Wear Seat belts while driving.  Please note: You were cared for by a hospitalist during your hospital stay. Once you are discharged, your primary care physician will handle any further medical issues. Please note that NO REFILLS for any discharge medications will be authorized once you are discharged, as it is imperative that you return to your primary care physician (or establish a relationship with a primary care physician if you do not have one) for your post hospital discharge needs so that they can reassess your need for medications and monitor your lab values.  Total Time spent coordinating discharge  including counseling, education and face to face time equals 25  minutes.  Signed: Shanker Ghimire 09/04/2017 11:02 AM

## 2017-09-04 NOTE — Progress Notes (Signed)
[facility] and report to accepting RN, volunteering consent signed.  [place] transport [facility], parents at bedside.  [facility]

## 2017-09-04 NOTE — Progress Notes (Signed)
Patient was seen and examined No major issues overnight-she does not have any complaints.  Her husband was sleeping at bedside.  Sitter in place. Telemetry-negative. She has voluntarily agreed to go to [facility]/inpatient psych-she is medically stable at this time to be transferred when a bed is available.  Patient is also aware that if she decides to leave the hospital then we may need to involuntarily commit her.   I have also made the RN aware that if the patient decides to leave the hospital then we need to involuntarily commit her.  See discharge summary for further details.

## 2017-09-04 NOTE — BH Assessment (Signed)
Patient accepted [facility], Dr. Jama Flavorsobos attending. Bed 305-2. Please call [facility] [phone].

## 2017-09-04 NOTE — Progress Notes (Signed)
Belinda Avery is a 27 year old female being admitted voluntarily to 305-2 from [facility] floor.  She was admitted medically after intentional OD on multiple medications.  She reported stressors as argument with husband, work issues and financial stressors because her husband has lost his job.  [facility], she was pleasant and cooperative.  Affect sad/depressed.  She stated that "I just got overwhelmed and didn't know what else to do."  She currently denies SI/HI or A/V hallucinations.  She has agreed to contract for safety on the unit.  Oriented her to the unit.  Admission paperwork completed and signed.  Belongings searched and secured in locker # 21, no contraband found.  Skin assessment completed and no skin issues noted.  Q 15 minute checks initiated for safety.  We will continue to monitor the progress towards her goals.

## 2017-09-04 NOTE — Tx Team (Signed)
Initial Treatment Plan 09/04/2017 11:36 PM Belinda BoozeKimberly A Brandis UJW:[id number]:[id number]    PATIENT STRESSORS: Financial difficulties Marital or family conflict   PATIENT STRENGTHS: Wellsite geologistCommunication skills General fund of knowledge Motivation for treatment/growth Physical Health   PATIENT IDENTIFIED PROBLEMS: Depression  Suicidal ideation  Anxiety   "Learn how to manage my anxiety"  "I want to learn how to deal with stress better"             DISCHARGE CRITERIA:  Improved stabilization in mood, thinking, and/or behavior Verbal commitment to aftercare and medication compliance  PRELIMINARY DISCHARGE PLAN: Outpatient therapy Medication management  PATIENT/FAMILY INVOLVEMENT: This treatment plan has been presented to and reviewed with the patient, Belinda Avery.  The patient and family have been given the opportunity to ask questions and make suggestions.  Levin BaconHeather V Dameian Crisman, RN 09/04/2017, 11:36 PM

## 2017-09-05 DIAGNOSIS — R450 Nervousness: Secondary | ICD-10-CM

## 2017-09-05 DIAGNOSIS — T1491XA Suicide attempt, initial encounter: Secondary | ICD-10-CM

## 2017-09-05 DIAGNOSIS — Z566 Other physical and mental strain related to work: Secondary | ICD-10-CM

## 2017-09-05 DIAGNOSIS — Z6379 Other stressful life events affecting family and household: Secondary | ICD-10-CM

## 2017-09-05 DIAGNOSIS — T43222A Poisoning by selective serotonin reuptake inhibitors, intentional self-harm, initial encounter: Secondary | ICD-10-CM

## 2017-09-05 DIAGNOSIS — T398X2A Poisoning by other nonopioid analgesics and antipyretics, not elsewhere classified, intentional self-harm, initial encounter: Secondary | ICD-10-CM

## 2017-09-05 DIAGNOSIS — R443 Hallucinations, unspecified: Secondary | ICD-10-CM

## 2017-09-05 DIAGNOSIS — G4700 Insomnia, unspecified: Secondary | ICD-10-CM

## 2017-09-05 DIAGNOSIS — F1099 Alcohol use, unspecified with unspecified alcohol-induced disorder: Secondary | ICD-10-CM

## 2017-09-05 DIAGNOSIS — T402X2A Poisoning by other opioids, intentional self-harm, initial encounter: Secondary | ICD-10-CM

## 2017-09-05 DIAGNOSIS — T426X2A Poisoning by other antiepileptic and sedative-hypnotic drugs, intentional self-harm, initial encounter: Secondary | ICD-10-CM

## 2017-09-05 DIAGNOSIS — Z9149 Other personal history of psychological trauma, not elsewhere classified: Secondary | ICD-10-CM

## 2017-09-05 DIAGNOSIS — F332 Major depressive disorder, recurrent severe without psychotic features: Principal | ICD-10-CM

## 2017-09-05 DIAGNOSIS — F419 Anxiety disorder, unspecified: Secondary | ICD-10-CM

## 2017-09-05 DIAGNOSIS — T424X2A Poisoning by benzodiazepines, intentional self-harm, initial encounter: Secondary | ICD-10-CM

## 2017-09-05 LAB — RAPID URINE DRUG SCREEN, HOSP PERFORMED
Amphetamines: NOT DETECTED
BENZODIAZEPINES: NOT DETECTED
Barbiturates: NOT DETECTED
COCAINE: NOT DETECTED
OPIATES: NOT DETECTED
Tetrahydrocannabinol: NOT DETECTED

## 2017-09-05 MED ORDER — TRAZODONE HCL 50 MG PO TABS
50.0000 mg | ORAL_TABLET | Freq: Every evening | ORAL | Status: DC | PRN
  Administered 2017-09-05 – 2017-09-06 (×2): 50 mg via ORAL
  Filled 2017-09-05 (×7): qty 1

## 2017-09-05 NOTE — Progress Notes (Signed)
Patient did not attend the evening speaker AA meeting. Pt was notified that group was beginning but did not attend.   

## 2017-09-05 NOTE — Progress Notes (Signed)
Pt attended the relaxation group.

## 2017-09-05 NOTE — [facility] Group Notes (Signed)
[facility] LCS[facility] Group Therapy Note  Date/Time:  09/05/2017 10:00-11:00AM  Type of Therapy and Topic:  Group Therapy:  Healthy and Unhealthy Supports  Participation Level:  Active   Description of Group:  Patients in this group were introduced to the idea of adding a variety of healthy supports to address the various needs in their lives.Patients discussed what additional healthy supports could be helpful in their recovery and wellness after discharge in order to prevent future hospitalizations.   An emphasis was placed on using counselor, doctor, therapy groups, 12-step groups, and problem-specific support groups to expand supports.  They also worked as a group on developing a specific plan for several patients to deal with unhealthy supports through boundary-setting, psychoeducation with loved ones, and even termination of relationships.   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   Therapeutic Modalities:   Motivational Interviewing Brief Solution-Focused Therapy  Ambrose MantleMareida Grossman-Orr, LCSW
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
## 2017-09-05 NOTE — [facility] Group Notes (Signed)
Pt was invited but did not attend orientation/goals group. 

## 2017-09-05 NOTE — [facility] Group Notes (Signed)
[facility] Group Notes:  (Nursing/MHT/Case Management/Adjunct)  Date:  09/05/2017  Time:  3:40 PM Type of Therapy:  Psychoeducational Skills  Participation Level:  Active  Participation Quality:  Appropriate  Affect:  Appropriate  Cognitive:  Appropriate  Insight:  Appropriate  Engagement in Group:  Engaged  Modes of Intervention:  Problem-solving  Summary of Progress/Problems: Summary of Progress/Problems: Topic was on healthy support system.  Group encouraged to surround themselves with positive and healthy group/support system when changing to a healthy life style.     Bethann PunchesJane O Fia Hebert 09/05/2017, 3:40 PM

## 2017-09-05 NOTE — Progress Notes (Signed)
DAR NOTE: Pt present with flat affect and depressed mood in the unit. Pt has been in the dayroom most of the day with peers. Pt attended group and was bright and active.  Pt denies physical pain, took all her meds as scheduled. As per self inventory, pt had a poor night sleep, good appetite, normal energy, and good concentration. Pt rate depression at 0, hopeless ness at 0. Pt's safety ensured with 15 minute and environmental checks. Pt currently denies SI/HI and A/V hallucinations. Pt verbally agrees to seek staff if SI/HI or A/VH occurs and to consult with staff before acting on these thoughts. Will continue POC.
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
## 2017-09-06 DIAGNOSIS — Z975 Presence of (intrauterine) contraceptive device: Secondary | ICD-10-CM

## 2017-09-06 LAB — COMPREHENSIVE METABOLIC PANEL
ALT: 15 U/L (ref 14–54)
ANION GAP: 7 (ref 5–15)
AST: 21 U/L (ref 15–41)
Albumin: 4.3 g/dL (ref 3.5–5.0)
Alkaline Phosphatase: 38 U/L (ref 38–126)
BILIRUBIN TOTAL: 0.6 mg/dL (ref 0.3–1.2)
BUN: 14 mg/dL (ref 6–20)
CHLORIDE: 102 mmol/L (ref 101–111)
CO2: 26 mmol/L (ref 22–32)
Calcium: 9.5 mg/dL (ref 8.9–10.3)
Creatinine, Ser: 0.88 mg/dL (ref 0.44–1.00)
Glucose, Bld: 158 mg/dL — ABNORMAL HIGH (ref 65–99)
POTASSIUM: 3.7 mmol/L (ref 3.5–5.1)
Sodium: 135 mmol/L (ref 135–145)
TOTAL PROTEIN: 7.7 g/dL (ref 6.5–8.1)

## 2017-09-06 LAB — TSH: TSH: 1.957 u[IU]/mL (ref 0.350–4.500)

## 2017-09-06 NOTE — Progress Notes (Signed)
D: Pt stated she was doing better A: Pt was offered support and encouragement. Pt was given scheduled medications. Pt was encourage to attend groups. Q 15 minute checks were done for safety.   R:Pt attends groups and interacts well with peers and staff. Pt is taking medication. Pt has no complaints.Pt receptive to treatment and safety maintained on unit.
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
## 2017-09-06 NOTE — Progress Notes (Signed)
Adult Psychoeducational Group Note  Date:  09/06/2017 Time:  7:05 PM  Group Topic/Focus:  Wellness Toolbox:   The focus of this group is to discuss various aspects of wellness, balancing those aspects and exploring ways to increase the ability to experience wellness.  Patients will create a wellness toolbox for use upon discharge.  Participation Level:  Active  Participation Quality:  Appropriate  Affect:  Appropriate  Cognitive:  Alert and Appropriate  Insight: Appropriate, Good and Improving  Engagement in Group:  Engaged  Modes of Intervention:  Activity and Discussion  Additional Comments:  Pt did participate in group activity and discussion.  Evalisse Prajapati R Banita Lehn 09/06/2017, 7:05 PM

## 2017-09-06 NOTE — Progress Notes (Signed)
Recreation Therapy Notes  Date: 09/06/17 Time: 0930 Location: 300 [address] Dayroom  Group Topic: Stress Management  Goal Area(s) Addresses:  Patient will verbalize importance of using healthy stress management.  Patient will identify positive emotions associated with healthy stress management.   Intervention: Stress Management  Activity :  Gratitude Meditation.  LRT played meditation from Calm app to allow patients to meditate on being grateful for the things, skills or people in their lives that have had a positive effect on their lives.  Education: Stress Management, Discharge Planning.   Education Outcome: Acknowledges edcuation/In group clarification offered/Needs additional education  Clinical Observations/Feedback: Pt did not attend group.    Malorie Bigford Linday, LRT/CTRS         Charlen Bakula A 09/06/2017 12:28 PM 

## 2017-09-06 NOTE — [facility] Suicide Risk Assessment (Signed)
[facility] INPATIENT:  Family/Significant Other Suicide Prevention Education  Suicide Prevention Education:  Education Completed; Darrol AngelLucas Deegan (pt's husband) [phone] has been identified by the patient as the family member/significant other with whom the patient will be residing, and identified as the person(s) who will aid the patient in the event of a mental health crisis (suicidal ideations/suicide attempt).  With written consent from the patient, the family member/significant other has been provided the following suicide prevention education, prior to the and/or following the discharge of the patient.  The suicide prevention education provided includes the following:  Suicide risk factors  Suicide prevention and interventions  National Suicide Hotline telephone number  [facility] assessment telephone number  [facility] Emergency Assistance 911  [facility] and/or [facility]idential [facility] Crisis [facility] telephone number  Request made of family/significant other to:  Remove weapons (e.g., guns, rifles, knives), all items previously/currently identified as safety concern.    Remove drugs/medications (over-the-counter, prescriptions, illicit drugs), all items previously/currently identified as a safety concern.  The family member/significant other verbalizes understanding of the suicide prevention education information provided.  The family member/significant other agrees to remove the items of safety concern listed above.  Pt's husband is comfortable with pt discharging home on Tuesday, 09/07/17 and plans to pick her up after lunch. He states that she sounds "so much better" and that "this was definitely a wake up call for her that she can't drink anymore." Pt's husband reports that both pt and he are giving up alcohol. He agreed to marriage counseling referral and states that both he and pt's family "are very supportive of anything she wants to do to get better." He reports  that there are no guns/ weapons in the home. SPE and aftercare plan reviewed with him.   Caliph Borowiak N Smart LCSW 09/06/2017, 2:32 PM

## 2017-09-06 NOTE — [facility] Group Notes (Signed)
[facility] Group Therapy 09/06/2017 1:15pm  Type of Therapy: Mental Health Association Presentation  Participation Level: Active  Participation Quality: Attentive  Affect: Appropriate  Cognitive: Oriented  Insight: Developing/Improving  Engagement in Therapy: Engaged  Modes of Intervention: Discussion, Education and Socialization  Summary of Progress/Problems: Mental Health Association (MHA) Speaker came to talk about his personal journey with mental health. The pt processed ways by which to relate to the speaker. MHA speaker provided handouts and educational information pertaining to groups and services offered by the MHA. Pt was engaged in speaker's presentation and was receptive to resources provided.    Jazz Biddy N Smart, LCSW 09/06/2017 1:57 PM  

## 2017-09-06 NOTE — Progress Notes (Signed)
DAR NOTE: Pt present with flat affect and calm mood in the unit. Pt has been visible in the milieu but not interacting much with peers.  Pt denies physical pain. As per self inventory, pt had a good night sleep, good appetite, normal energy, and good concentration. Pt rate depression at 0, hopeless ness at 0, and anxiety at 0. Pt's safety ensured with 15 minute and environmental checks. Pt currently denies SI/HI and A/V hallucinations. Pt verbally agrees to seek staff if SI/HI or A/VH occurs and to consult with staff before acting on these thoughts. Will continue POC.

## 2017-09-06 NOTE — Tx Team (Signed)
Interdisciplinary Treatment and Diagnostic Plan Update  09/06/2017 Time of Session: 0830AM Belinda Avery MRN: [id number]  Principal Diagnosis: Severe recurrent major depression without psychotic features [facility])  Secondary Diagnoses: Principal Problem:   Severe recurrent major depression without psychotic features ([place])   Current Medications:  Current Facility-Administered Medications  Medication Dose Route Frequency Provider Last Rate Last Dose  . alum & mag hydroxide-simeth (MAALOX/MYLANTA) 200-200-20 MG/5ML suspension 30 mL  30 mL Oral Q4H PRN Lindon Romp A, NP      . magnesium hydroxide (MILK OF MAGNESIA) suspension 30 mL  30 mL Oral Daily PRN Lindon Romp A, NP      . traZODone (DESYREL) tablet 50 mg  50 mg Oral QHS,MR X 1 Derrill Center, NP   50 mg at 09/05/17 2113   PTA Medications: Medications Prior to Admission  Medication Sig Dispense Refill Last Dose  . ibuprofen (ADVIL,MOTRIN) 400 MG tablet Take 1 tablet (400 mg total) by mouth every 6 (six) hours as needed for fever, headache, mild pain or moderate pain. 30 tablet 0   . ondansetron (ZOFRAN) 4 MG tablet Take 1 tablet (4 mg total) by mouth every 6 (six) hours as needed for nausea. 20 tablet 0     Patient Stressors: Financial difficulties Marital or family conflict  Patient Strengths: Curator fund of knowledge Motivation for treatment/growth Physical Health  Treatment Modalities: Medication Management, Group therapy, Case management,  1 to 1 session with clinician, Psychoeducation, Recreational therapy.   Physician Treatment Plan for Primary Diagnosis: Severe recurrent major depression without psychotic features ([place]) Long Term Goal(s): Improvement in symptoms so as ready for discharge Improvement in symptoms so as ready for discharge   Short Term Goals: Ability to identify changes in lifestyle to reduce recurrence of condition will improve Ability to verbalize feelings will  improve Ability to identify and develop effective coping behaviors will improve Ability to maintain clinical measurements within normal limits will improve Ability to disclose and discuss suicidal ideas Ability to demonstrate self-control will improve Compliance with prescribed medications will improve  Medication Management: Evaluate patient's response, side effects, and tolerance of medication regimen.  Therapeutic Interventions: 1 to 1 sessions, Unit Group sessions and Medication administration.  Evaluation of Outcomes: Not Met  Physician Treatment Plan for Secondary Diagnosis: Principal Problem:   Severe recurrent major depression without psychotic features ([place])  Long Term Goal(s): Improvement in symptoms so as ready for discharge Improvement in symptoms so as ready for discharge   Short Term Goals: Ability to identify changes in lifestyle to reduce recurrence of condition will improve Ability to verbalize feelings will improve Ability to identify and develop effective coping behaviors will improve Ability to maintain clinical measurements within normal limits will improve Ability to disclose and discuss suicidal ideas Ability to demonstrate self-control will improve Compliance with prescribed medications will improve     Medication Management: Evaluate patient's response, side effects, and tolerance of medication regimen.  Therapeutic Interventions: 1 to 1 sessions, Unit Group sessions and Medication administration.  Evaluation of Outcomes: Not Met   RN Treatment Plan for Primary Diagnosis: Severe recurrent major depression without psychotic features ([place]) Long Term Goal(s): Knowledge of disease and therapeutic regimen to maintain health will improve  Short Term Goals: Ability to remain free from injury will improve, Ability to disclose and discuss suicidal ideas and Ability to identify and develop effective coping behaviors will improve  Medication Management: RN will  administer medications as ordered by provider, will assess and evaluate patient's  response and provide education to patient for prescribed medication. RN will report any adverse and/or side effects to prescribing provider.  Therapeutic Interventions: 1 on 1 counseling sessions, Psychoeducation, Medication administration, Evaluate responses to treatment, Monitor vital signs and CBGs as ordered, Perform/monitor CIWA, COWS, AIMS and Fall Risk screenings as ordered, Perform wound care treatments as ordered.  Evaluation of Outcomes: Not Met   LCSW Treatment Plan for Primary Diagnosis: Severe recurrent major depression without psychotic features ([place]) Long Term Goal(s): Safe transition to appropriate next level of care at discharge, Engage patient in therapeutic group addressing interpersonal concerns.  Short Term Goals: Engage patient in aftercare planning with referrals and resources, Facilitate acceptance of mental health diagnosis and concerns and Facilitate patient progression through stages of change regarding substance use diagnoses and concerns  Therapeutic Interventions: Assess for all discharge needs, 1 to 1 time with Social worker, Explore available resources and support systems, Assess for adequacy in community support network, Educate family and significant other(s) on suicide prevention, Complete Psychosocial Assessment, Interpersonal group therapy.  Evaluation of Outcomes: Not Met   Progress in Treatment: Attending groups: No. New to unit. Continuing to assess.  Participating in groups: No. Taking medication as prescribed: Yes. Toleration medication: Yes. Family/Significant other contact made: No, will contact:  family member if patient consents to collateral contact.  Patient understands diagnosis: Yes. Discussing patient identified problems/goals with staff: Yes. Medical problems stabilized or resolved: Yes. Denies suicidal/homicidal ideation: Yes. Issues/concerns per patient  self-inventory: No. Other: n/a   New problem(s) identified: No, Describe:  n/a  New Short Term/Long Term Goal(s): detox, medication management for mood stabilization; elimination of SI thoughts; development of comprehensive mental wellness/sobriety plan.   Patient Goal: "to learn how to handle stressors better."   Discharge Plan or Barriers: CSW assessing for appropriate referrals. Pt provided with [place] pamphlet and AA/NA information for additional community supports.   Reason for Continuation of Hospitalization: Anxiety Depression Medication stabilization Suicidal ideation Withdrawal symptoms  Estimated Length of Stay: Wed, 09/08/17  Attendees: Patient: 09/06/2017 8:58 AM  Physician: Dr. Sanjuana Letters MD; Dr. Nancy Fetter MD 09/06/2017 8:58 AM  Nursing: Trinna Post RN; Opal Sidles RN 09/06/2017 8:58 AM  RN Care Manager: Lars Pinks CM 09/06/2017 8:58 AM  Social Worker: Maxie Better, LCSW 09/06/2017 8:58 AM  Recreational Therapist: x 09/06/2017 8:58 AM  Other: Ricky Ala NP; Darnelle Maffucci Money NP 09/06/2017 8:58 AM  Other:  09/06/2017 8:58 AM  Other: 09/06/2017 8:58 AM    Scribe for Treatment Team: [place], LCSW 09/06/2017 8:58 AM

## 2017-09-06 NOTE — Progress Notes (Signed)
[facility] MD Progress Note  09/06/2017 2:15 PM Belinda Avery  MRN:  [id number]   Subjective:  Patient reports that she is doing "fine" today. She states that she has quit her job today since that was the stressful part of her life right now. She reports that she has not been drinking daily and she does not have a problem drinking. She wants to discharge and go home. She states that she does not want any medications because she doesn't want to be dependent on them. She reports that she wants to go home to her husband and they have discussed what has been going on and they have worked things out. She denies any SI/HI/AVh and contrcats for safety.  Objective: Patient's chart and and findings reviewed and discussed with treatment team. Patient presents in her room. She has been seen in the day room interacting with staff and peers. She minimizes the overdose, her depression and how much alcohol she drinks. Will request CSW to get collateral from husband.   Principal Problem: Severe recurrent major depression without psychotic features ([place]) Diagnosis:   Patient Active Problem List   Diagnosis Date Noted  . Severe recurrent major depression without psychotic features ([place]) [F33.2] 09/04/2017  . Adjustment disorder with mixed disturbance of emotions and conduct [F43.25]   . Drug overdose [T50.901A] 09/02/2017  . Pregnancy, location unknown [Z34.90] 05/04/2014  . Paresthesias [R20.2] 10/25/2013  . UNSPECIFIED ESSENTIAL HYPERTENSION [I10] 03/12/2010  . TACHYCARDIA [R00.0] 03/12/2010   Total Time spent with patient: 15 minutes  Past Psychiatric History: See H&P  Past Medical History:  Past Medical History:  Diagnosis Date  . Anxiety   . Depression     Past Surgical History:  Procedure Laterality Date  . HYSTEROSCOPY W/D&C N/A 06/01/2014   Procedure: DILATATION AND CURETTAGE WITH FROZEN SECTION, Removal of IUD;  Surgeon: Farrel Gobble. Harrington Challenger, MD;  Location: [facility];  Service: Gynecology;  Laterality:  N/A;  WITH FROZEN SECTION  . nose cauterization     Family History:  Family History  Problem Relation Age of Onset  . Cancer Other   . Thyroid disease Other   . Diabetes Other   . Coronary artery disease Other   . Cancer Maternal Grandmother   . Cancer Maternal Grandfather   . Cancer Paternal Grandmother    Family Psychiatric  History: See H&P Social History:  Social History   Substance and Sexual Activity  Alcohol Use Yes   Comment: 3 glasses of wine/week     Social History   Substance and Sexual Activity  Drug Use No    Social History   Socioeconomic History  . Marital status: Married    Spouse name: None  . Number of children: 0  . Years of education: None  . Highest education level: None  Social Needs  . Financial resource strain: None  . Food insecurity - worry: None  . Food insecurity - inability: None  . Transportation needs - medical: None  . Transportation needs - non-medical: None  Occupational History  . Occupation: Ship broker    Comment: [employer], Therapist, occupational  Tobacco Use  . Smoking status: Never Smoker  . Smokeless tobacco: Never Used  Substance and Sexual Activity  . Alcohol use: Yes    Comment: 3 glasses of wine/week  . Drug use: No  . Sexual activity: Yes    Birth control/protection: IUD  Other Topics Concern  . None  Social History Ambulance person. Part time- CNA at [employer]  OB/GYN. Single    No children   Right handed   Working toward [employer] degree   1-2 cups daily   Additional Social History:                         Sleep: Good  Appetite:  Good  Current Medications: Current Facility-Administered Medications  Medication Dose Route Frequency Provider Last Rate Last Dose  . alum & mag hydroxide-simeth (MAALOX/MYLANTA) 200-200-20 MG/5ML suspension 30 mL  30 mL Oral Q4H PRN Lindon Romp A, NP      . magnesium hydroxide (MILK OF MAGNESIA) suspension 30 mL  30 mL Oral Daily PRN Lindon Romp A, NP      .  traZODone (DESYREL) tablet 50 mg  50 mg Oral QHS,MR X 1 Derrill Center, NP   50 mg at 09/05/17 2113    Lab Results:  Results for orders placed or performed during the hospital encounter of 09/04/17 (from the past 48 hour(s))  Urine rapid drug screen (hosp performed)     Status: None   Collection Time: 09/05/17  1:06 PM  Result Value Ref Range   Opiates NONE DETECTED NONE DETECTED   Cocaine NONE DETECTED NONE DETECTED   Benzodiazepines NONE DETECTED NONE DETECTED   Amphetamines NONE DETECTED NONE DETECTED   Tetrahydrocannabinol NONE DETECTED NONE DETECTED   Barbiturates NONE DETECTED NONE DETECTED    Comment: (NOTE) DRUG SCREEN FOR MEDICAL PURPOSES ONLY.  IF CONFIRMATION IS NEEDED FOR ANY PURPOSE, NOTIFY LAB WITHIN 5 DAYS. LOWEST DETECTABLE LIMITS FOR URINE DRUG SCREEN Drug Class                     Cutoff (ng/mL) Amphetamine and metabolites    1000 Barbiturate and metabolites    200 Benzodiazepine                 517 Tricyclics and metabolites     300 Opiates and metabolites        300 Cocaine and metabolites        300 THC                            50 Performed at [facility], [place] [address] [place], [place] [address]   Comprehensive metabolic panel     Status: Abnormal   Collection Time: 09/06/17  7:18 AM  Result Value Ref Range   Sodium 135 135 - 145 mmol/L   Potassium 3.7 3.5 - 5.1 mmol/L   Chloride 102 101 - 111 mmol/L   CO2 26 22 - 32 mmol/L   Glucose, Bld 158 (H) 65 - 99 mg/dL   BUN 14 6 - 20 mg/dL   Creatinine, Ser 0.88 0.44 - 1.00 mg/dL   Calcium 9.5 8.9 - 10.3 mg/dL   Total Protein 7.7 6.5 - 8.1 g/dL   Albumin 4.3 3.5 - 5.0 g/dL   AST 21 15 - 41 U/L   ALT 15 14 - 54 U/L   Alkaline Phosphatase 38 38 - 126 U/L   Total Bilirubin 0.6 0.3 - 1.2 mg/dL   GFR calc non Af Amer >60 >60 mL/min   GFR calc Af Amer >60 >60 mL/min    Comment: (NOTE) The eGFR has been calculated using the CKD EPI equation. This calculation has not been validated  in all clinical situations. eGFR's persistently <60 mL/min signify possible Chronic Kidney Disease.    Anion gap  7 5 - 15    Comment: Performed at [facility], [place] [address] [place], [place] [address]  TSH     Status: None   Collection Time: 09/06/17  7:18 AM  Result Value Ref Range   TSH 1.957 0.350 - 4.500 uIU/mL    Comment: Performed by a 3rd Generation assay with a functional sensitivity of <=0.01 uIU/mL. Performed at [facility], [place] [address], [place], [place] [address]     Blood Alcohol level:  Lab Results  Component Value Date   ETH 201 (H) 96/22/2979    Metabolic Disorder Labs: No results found for: HGBA1C, MPG No results found for: PROLACTIN No results found for: CHOL, TRIG, HDL, CHOLHDL, VLDL, LDLCALC  Physical Findings: AIMS: Facial and Oral Movements Muscles of Facial Expression: None, normal Lips and Perioral Area: None, normal Jaw: None, normal Tongue: None, normal,Extremity Movements Upper (arms, wrists, hands, fingers): None, normal Lower (legs, knees, ankles, toes): None, normal, Trunk Movements Neck, shoulders, hips: None, normal, Overall Severity Severity of abnormal movements (highest score from questions above): None, normal Incapacitation due to abnormal movements: None, normal Patient's awareness of abnormal movements (rate only patient's report): No Awareness, Dental Status Current problems with teeth and/or dentures?: No Does patient usually wear dentures?: No  CIWA:    COWS:     Musculoskeletal: Strength & Muscle Tone: within normal limits Gait & Station: normal Patient leans: N/A  Psychiatric Specialty Exam: Physical Exam  Nursing note and vitals reviewed. Constitutional: She is oriented to person, place, and time. She appears well-developed.  Respiratory: Effort normal.  GI: Soft.  Musculoskeletal: Normal range of motion.  Neurological: She is alert and oriented to person, place, and time.   Skin: Skin is warm.    Review of Systems  Constitutional: Negative.   HENT: Negative.   Eyes: Negative.   Respiratory: Negative.   Cardiovascular: Negative.   Gastrointestinal: Negative.   Genitourinary: Negative.   Musculoskeletal: Negative.   Skin: Negative.   Neurological: Negative.   Endo/Heme/Allergies: Negative.   Psychiatric/Behavioral: Negative.   Patient denies all mental health symptoms  Blood pressure (!) 88/64, pulse (!) 116, temperature 98.6 F (37 C), temperature source Oral, resp. rate 16, height _0  (1.575 m), weight 47.2 kg (104 lb), unknown if currently breastfeeding.Body mass index is 19.02 kg/m.  General Appearance: Casual  Eye Contact:  Good  Speech:  Clear and Coherent and Normal Rate  Volume:  Normal  Mood:  Euthymic  Affect:  Flat  Thought Process:  Goal Directed and Descriptions of Associations: Intact  Orientation:  Full (Time, Place, and Person)  Thought Content:  WDL  Suicidal Thoughts:  No  Homicidal Thoughts:  No  Memory:  Immediate;   Good Recent;   Good Remote;   Good  Judgement:  Good  Insight:  Fair  Psychomotor Activity:  Normal  Concentration:  Concentration: Good and Attention Span: Good  Recall:  Good  Fund of Knowledge:  Good  Language:  Good  Akathisia:  No  Handed:  Right  AIMS (if indicated):     Assets:  Communication Skills Desire for Improvement Financial Resources/Insurance Housing Physical Health Social Support Transportation  ADL's:  Intact  Cognition:  WNL  Sleep:  Number of Hours: 6.5   Problems Addressed: MDD severe  Treatment Plan Summary: Daily contact with patient to assess and evaluate symptoms and progress in treatment, Medication management and Plan is to:  -Encourage medication use -Encourage group therapy participation  [place]  B Westley Blass, FNP 09/06/2017, 2:15 PM

## 2017-09-06 NOTE — Plan of Care (Signed)
  Coping: Ability to verbalize frustrations and anger appropriately will improve 09/06/2017 2219 - Progressing by Delos HaringPhillips, Sable Knoles A, RN Note Pt stated she was feeling better, did not have issues with anger/ frustrations

## 2017-09-07 DIAGNOSIS — Z630 Problems in relationship with spouse or partner: Secondary | ICD-10-CM

## 2017-09-07 DIAGNOSIS — F10129 Alcohol abuse with intoxication, unspecified: Secondary | ICD-10-CM

## 2017-09-07 DIAGNOSIS — Y907 Blood alcohol level of 200-239 mg/100 ml: Secondary | ICD-10-CM

## 2017-09-07 DIAGNOSIS — Z598 Other problems related to housing and economic circumstances: Secondary | ICD-10-CM

## 2017-09-07 NOTE — Progress Notes (Signed)
D:  Patient's self inventory sheet, patient sleeps good, sleep medication helpful.  Good appetite, normal energy level, good concentration.  Denied depression, hopeless and anxiety.  Denied withdrawals.  Denied SI.  Denied physical problems.  Denied physical pain.  Goal to search for an AA meeting that she can attend.  Plans to ask for list of resources and groups.  Had amazing group yesterday with Caren.  Stated she has really been opening up in group.  Does have discharge plans. A:  Medications administered per MD orders.  Emotional support and encouragement given patient.

## 2017-09-07 NOTE — Plan of Care (Signed)
Completed/Met Activity: Interest or engagement in activities will improve 09/07/2017 1041 - Completed/Met by Joice Lofts, RN Sleeping patterns will improve 09/07/2017 1041 - Completed/Met by Joice Lofts, RN Education: Knowledge of [place] Education information/materials will improve 09/07/2017 1041 - Completed/Met by Joice Lofts, RN Emotional status will improve 09/07/2017 1041 - Completed/Met by Joice Lofts, RN Mental status will improve 09/07/2017 1041 - Completed/Met by Joice Lofts, RN Verbalization of understanding the information provided will improve 09/07/2017 1041 - Completed/Met by Joice Lofts, RN Coping: Ability to verbalize frustrations and anger appropriately will improve 09/07/2017 1041 - Completed/Met by Joice Lofts, RN Ability to demonstrate self-control will improve 09/07/2017 1041 - Completed/Met by Joice Lofts, RN Health Behavior/Discharge Planning: Identification of resources available to assist in meeting health care needs will improve 09/07/2017 1041 - Completed/Met by Joice Lofts, RN Compliance with treatment plan for underlying cause of condition will improve 09/07/2017 1041 - Completed/Met by Joice Lofts, RN Physical Regulation: Ability to maintain clinical measurements within normal limits will improve 09/07/2017 1041 - Completed/Met by Joice Lofts, RN Safety: Periods of time without injury will increase 09/07/2017 1041 - Completed/Met by Joice Lofts, RN Activity: Interest or engagement in leisure activities will improve 09/07/2017 1041 - Completed/Met by Joice Lofts, RN Imbalance in normal sleep/wake cycle will improve 09/07/2017 1041 - Completed/Met by Joice Lofts, RN Education: Utilization of techniques to improve thought processes will improve 09/07/2017 1041 - Completed/Met by Joice Lofts, RN Knowledge of the prescribed therapeutic regimen will  improve 09/07/2017 1041 - Completed/Met by Joice Lofts, RN Coping: Ability to cope will improve 09/07/2017 1041 - Completed/Met by Joice Lofts, RN Ability to verbalize feelings will improve 09/07/2017 1041 - Completed/Met by Joice Lofts, [place] Behavior/Discharge Planning: Ability to make decisions will improve 09/07/2017 1041 - Completed/Met by Joice Lofts, RN Compliance with therapeutic regimen will improve 09/07/2017 1041 - Completed/Met by Joice Lofts, RN Role Relationship: Ability to demonstrate positive changes in social behaviors and relationships will improve 09/07/2017 1041 - Completed/Met by Joice Lofts, RN Safety: Ability to disclose and discuss suicidal ideas will improve 09/07/2017 1041 - Completed/Met by Joice Lofts, RN Ability to identify and utilize support systems that promote safety will improve 09/07/2017 1041 - Completed/Met by Joice Lofts, RN Self-Concept: Ability to verbalize positive feelings about self will improve 09/07/2017 1041 - Completed/Met by Joice Lofts, RN Level of anxiety will decrease 09/07/2017 1041 - Completed/Met by Joice Lofts, RN Education: Ability to state activities that reduce stress will improve 09/07/2017 1041 - Completed/Met by Joice Lofts, RN Coping: Ability to identify and develop effective coping behavior will improve 09/07/2017 1041 - Completed/Met by Joice Lofts, RN Self-Concept: Ability to identify factors that promote anxiety will improve 09/07/2017 1041 - Completed/Met by Joice Lofts, RN Level of anxiety will decrease 09/07/2017 1041 - Completed/Met by Joice Lofts, RN Ability to modify response to factors that promote anxiety will improve 09/07/2017 1041 - Completed/Met by Joice Lofts, RN Education: Ability to make informed decisions regarding treatment will improve 09/07/2017 1041 - Completed/Met by Joice Lofts,  RN Coping: Ability to cope will improve 09/07/2017 1041 - Completed/Met by Joice Lofts, [place] Behavior/Discharge Planning: Identification of resources available to assist in meeting health care needs will improve 09/07/2017 1041 - Completed/Met by Joice Lofts, RN Medication: Compliance with prescribed medication regimen will improve  09/07/2017 1041 - Completed/Met by Joice Lofts, RN Self-Concept: Ability to disclose and discuss suicidal ideas will improve 09/07/2017 1041 - Completed/Met by Joice Lofts, RN Ability to verbalize positive feelings about self will improve 09/07/2017 1041 - Completed/Met by Joice Lofts, RN

## 2017-09-07 NOTE — Progress Notes (Signed)
Discharge Note:  Patient discharged home with family member.  Patient denied SI and HI.  Denied A/V hallucinations.  Suicide prevention information given and discussed with patient who stated she understood and had no questions.  [facility]  Patient stated she received all her belongings, clothing, toiletries, misc items, etc.  All required discharge information given to patient.  Patient stated [facility].

## 2017-09-07 NOTE — Discharge Summary (Signed)
Physician Discharge Summary Note  Patient:  Belinda Avery is an 27 y.o., female MRN:  [id number] DOB:  [birth year] Patient phone:  [phone] (home)  Patient address:   [address] [place] [address],  Total Time spent with patient: 20 minutes  Date of Admission:  09/04/2017 Date of Discharge: 09/07/17   Reason for Admission:  Worsening depression with suicide attempt while intoxicated  Principal Problem: Severe recurrent major depression without psychotic features [facility]) Discharge Diagnoses: Patient Active Problem List   Diagnosis Date Noted  . Severe recurrent major depression without psychotic features (HCC) [F33.2] 09/04/2017  . Adjustment disorder with mixed disturbance of emotions and conduct [F43.25]   . Drug overdose [T50.901A] 09/02/2017  . Pregnancy, location unknown [Z34.90] 05/04/2014  . Paresthesias [R20.2] 10/25/2013  . UNSPECIFIED ESSENTIAL HYPERTENSION [I10] 03/12/2010  . TACHYCARDIA [R00.0] 03/12/2010    Past Psychiatric History: Denies  Past Medical History:  Past Medical History:  Diagnosis Date  . Anxiety   . Depression     Past Surgical History:  Procedure Laterality Date  . HYSTEROSCOPY W/D&C N/A 06/01/2014   Procedure: DILATATION AND CURETTAGE WITH FROZEN SECTION, Removal of IUD;  Surgeon: Freddrick March. Tenny Craw, MD;  Location: [facility];  Service: Gynecology;  Laterality: N/A;  WITH FROZEN SECTION  . nose cauterization     Family History:  Family History  Problem Relation Age of Onset  . Cancer Other   . Thyroid disease Other   . Diabetes Other   . Coronary artery disease Other   . Cancer Maternal Grandmother   . Cancer Maternal Grandfather   . Cancer Paternal Grandmother    Family Psychiatric  History: Denies Social History:  Social History   Substance and Sexual Activity  Alcohol Use Yes   Comment: 3 glasses of wine/week     Social History   Substance and Sexual Activity  Drug Use No    Social History    Socioeconomic History  . Marital status: Married    Spouse name: None  . Number of children: 0  . Years of education: None  . Highest education level: None  Social Needs  . Financial resource strain: None  . Food insecurity - worry: None  . Food insecurity - inability: None  . Transportation needs - medical: None  . Transportation needs - non-medical: None  Occupational History  . Occupation: Consulting civil engineer    Comment: [employer], Investment banker, corporate  Tobacco Use  . Smoking status: Never Smoker  . Smokeless tobacco: Never Used  Substance and Sexual Activity  . Alcohol use: Yes    Comment: 3 glasses of wine/week  . Drug use: No  . Sexual activity: Yes    Birth control/protection: IUD  Other Topics Concern  . 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 Single    No children   Right handed   Working toward [employer] degree   1-2 cups daily    Hospital Course:   09/05/17 [facility] MD Assessment: Per consult note- .patient 27 y.o. She reports that she overdosed on multiple old prescriptions because she wanted to kill herself due to feeling overwhelmed about work and home stressors. Her pill bottles were collected. It appears she hada bottle of Lexapro 10 mg tablets (#30 prescribed in 2014), Prozac 10 mg tablets (#60 prescribed in 2014), Pyridium (#10 tablets prescribed in 2016), Hydrocodone 5-325 mg tablets (#20 prescribed in 2011) and Gabapentin 100 mg tablets (#90 prescribed in 2015). She also  reports taking her husband's Klonopin 1 mg tablets (#10 prescribed in 2009). Her BAL was 201 on admission. UDS was positive for opiates. She required IV fluid resuscitation (tachycardic to 140s and hypotensive to 90s-100/50s).  On interview by MD Norman,Belinda Avery reports work stressors. Her husband lost his job so she has been taking care of the household bills as well as her 27 y/o Museum/gallery conservator. 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 She reports frequent  arguments about finances more recently with her husband. She has experienced intermittent SI with these arguments. She reports acting on it this time due to being intoxicated which made her SI worst. She overdosed on multiple medications as noted above after her husband fell asleep on the couch. She was in "rage" and went to the bathroom to ingest the medications. She called her sister after and her sister called her husband who took her to the hospital. She is regretful about her attempt. She denies current SI, HI or AVH. She denies neurovegetative symptoms. She does reports a history of depression in college due to poorly managed anxiety. She reports current anxiety due to her stressors. She denies a history of manic symptoms. She denies a prior history of suicide attempts. On Evaluation: Belinda Avery is awake, alert and oriented. Seen resting in dayroom interacting with peers. Patient validates the information provided in the HPI.- Denies previous inpatient admissions. Reports however she was prescribed Lexapro in the past however states she doesn't t want to take any medicaitons at this time because she doesn't  want to be dependent on any medications. Patient present flat, guarded but pleasant. Reports she is employed by [employer].  Denies suicidal or homicidal ideation. Denies auditory or visual hallucination and does not appear to be responding to internal stimuli.Support, encouragement and reassurance was provided.   Patient remained on the [facility] unit for 2 days and stabilized with therapy and sobriety. Patient refused to start medications stating that she di not want to be dependent on any medications. Her husband provided collateral and felt that patient had improved and was ready to discharge home with him and their children. The patient showed improvement with improved mood, affect, sleep, appetite, and interaction. She was seen interacting with peers and staff appropriately. Patient has attending  groups. Patient agrees to follow up at [facility]. Patient denies any SI/HI/AVH and contracts for safety.    Physical Findings: AIMS: Facial and Oral Movements Muscles of Facial Expression: None, normal Lips and Perioral Area: None, normal Jaw: None, normal Tongue: None, normal,Extremity Movements Upper (arms, wrists, hands, fingers): None, normal Lower (legs, knees, ankles, toes): None, normal, Trunk Movements Neck, shoulders, hips: None, normal, Overall Severity Severity of abnormal movements (highest score from questions above): None, normal Incapacitation due to abnormal movements: None, normal Patient's awareness of abnormal movements (rate only patient's report): No Awareness, Dental Status Current problems with teeth and/or dentures?: No Does patient usually wear dentures?: No  CIWA:    COWS:     Musculoskeletal: Strength & Muscle Tone: within normal limits Gait & Station: normal Patient leans: N/A  Psychiatric Specialty Exam: Physical Exam  Nursing note and vitals reviewed. Constitutional: She is oriented to person, place, and time. She appears well-developed and well-nourished.  Cardiovascular: Normal rate.  Respiratory: Effort normal.  Musculoskeletal: Normal range of motion.  Neurological: She is alert and oriented to person, place, and time.  Skin: Skin is warm.    Review of Systems  Constitutional: Negative.  HENT: Negative.   Eyes: Negative.   Respiratory: Negative.   Cardiovascular: Negative.   Gastrointestinal: Negative.   Genitourinary: Negative.   Musculoskeletal: Negative.   Skin: Negative.   Neurological: Negative.   Endo/Heme/Allergies: Negative.   Psychiatric/Behavioral: Negative.     Blood pressure (!) 84/65, pulse 96, temperature 98.4 F (36.9 C), temperature source Oral, resp. rate 16, height 5\' 2"  (1.575 m), weight 47.2 kg (104 lb), unknown if currently breastfeeding.Body mass index is 19.02 kg/m.  General Appearance: Casual   Eye Contact:  Good  Speech:  Clear and Coherent and Normal Rate  Volume:  Normal  Mood:  Euthymic  Affect:  Congruent  Thought Process:  Goal Directed and Descriptions of Associations: Intact  Orientation:  Full (Time, Place, and Person)  Thought Content:  WDL  Suicidal Thoughts:  No  Homicidal Thoughts:  No  Memory:  Immediate;   Good Recent;   Good Remote;   Good  Judgement:  Good  Insight:  Good  Psychomotor Activity:  Normal  Concentration:  Concentration: Good and Attention Span: Good  Recall:  Good  Fund of Knowledge:  Good  Language:  Good  Akathisia:  No  Handed:  Right  AIMS (if indicated):     Assets:  Communication Skills Desire for Improvement Financial Resources/Insurance Housing Physical Health Social Support Transportation  ADL's:  Intact  Cognition:  WNL  Sleep:  Number of Hours: 6.75     Have you used any form of tobacco in the last 30 days? (Cigarettes, Smokeless Tobacco, Cigars, and/or Pipes): No  Has this patient used any form of tobacco in the last 30 days? (Cigarettes, Smokeless Tobacco, Cigars, and/or Pipes) Yes, No  Blood Alcohol level:  Lab Results  Component Value Date   ETH 201 (H) 09/02/2017    Metabolic Disorder Labs:  No results found for: HGBA1C, MPG No results found for: PROLACTIN No results found for: CHOL, TRIG, HDL, CHOLHDL, VLDL, LDLCALC  See Psychiatric Specialty Exam and Suicide Risk Assessment completed by Attending Physician prior to discharge.  Discharge destination:  Home  Is patient on multiple antipsychotic therapies at discharge:  No   Has Patient had three or more failed trials of antipsychotic monotherapy by history:  No  Recommended Plan for Multiple Antipsychotic Therapies: NA   Allergies as of 09/07/2017      Reactions   Macrobid [nitrofurantoin Monohyd Macro]    Burning in arms  & legs      Medication List    STOP taking these medications   ibuprofen 400 MG tablet Commonly known as:   ADVIL,MOTRIN   ondansetron 4 MG tablet Commonly known as:  ZOFRAN      Follow-up Information    [facility] Follow up on 09/13/2017.   Why:  Assessment with Pincus Sanesarolyn Rifkin at 9:30AM for therapy. To hold this appt, please stop by the office after discharge to pay $20 deposit and give them your insurance card so that they can make a copy. Thank you.  Contact information: [address] [place] [address] [phone]        Medication management appt not needed/patient is not prescribed psychiatric medication. Follow up.           Follow-up recommendations:  Continue activity as tolerated. Continue diet as recommended by your PCP. Ensure to keep all appointments with outpatient providers.  Comments:  Patient is instructed prior to discharge to: Take all medications as prescribed by his/her mental healthcare provider. Report any adverse effects  and or reactions from the medicines to his/her outpatient provider promptly. Patient has been instructed & cautioned: To not engage in alcohol and or illegal drug use while on prescription medicines. In the event of worsening symptoms, patient is instructed to call the crisis hotline, 911 and or go to the nearest ED for appropriate evaluation and treatment of symptoms. To follow-up with his/her primary care provider for your other medical issues, concerns and or health care needs.    Signed: Gerlene Burdock Money, FNP 09/07/2017, 9:30 AM   Patient seen, Suicide Assessment Completed.  Disposition Plan Reviewed

## 2017-09-07 NOTE — Progress Notes (Signed)
  [facility] :  Will you be returning to the same living situation after discharge:  Yes,  home At discharge, do you have transportation home?: Yes,  husband Do you have the ability to pay for your medications: Yes,  [employer]   Release of information consent forms completed and submitted to medical records by CSW.  Patient to Follow up at: Follow-up Information    [facility] Follow up on 09/13/2017.   Why:  Assessment with Pincus Sanesarolyn Rifkin at 9:30AM for therapy. To hold this appt, please stop by the office after discharge to pay $20 deposit and give them your insurance card so that they can make a copy. Thank you.  Contact information: [address] [place] [address] [phone]        Medication management appt not needed/patient is not prescribed psychiatric medication. Follow up.           Next level of care provider has access to [facility] Link:no  Safety Planning and Suicide Prevention discussed: Yes,  SPE completed with both pt and her husband. SPI pamphlet and Mobile Crisis information provided to pt.   Have you used any form of tobacco in the last 30 days? (Cigarettes, Smokeless Tobacco, Cigars, and/or Pipes): No  Has patient been referred to the Quitline?: N/A patient is not a smoker  Patient has been referred for addiction treatment: Yes  Pulte HomesHeather N Smart, LCSW 09/07/2017, 9:19 AM

## 2017-09-07 NOTE — [facility] Suicide Risk Assessment (Signed)
[facility] Discharge Suicide Risk Assessment   Principal Problem: Severe recurrent major depression without psychotic features [facility]) Discharge Diagnoses:  Patient Active Problem List   Diagnosis Date Noted  . Severe recurrent major depression without psychotic features (HCC) [F33.2] 09/04/2017  . Adjustment disorder with mixed disturbance of emotions and conduct [F43.25]   . Drug overdose [T50.901A] 09/02/2017  . Pregnancy, location unknown [Z34.90] 05/04/2014  . Paresthesias [R20.2] 10/25/2013  . UNSPECIFIED ESSENTIAL HYPERTENSION [I10] 03/12/2010  . TACHYCARDIA [R00.0] 03/12/2010    Total Time spent with patient: 30 minutes  Musculoskeletal: Strength & Muscle Tone: within normal limits Gait & Station: normal Patient leans: N/A  Psychiatric Specialty Exam: ROS denies headache, no chest pain, no shortness of breath, no nausea, no vomiting,  reports some AM dizziness, but now resolved . No rash  Blood pressure (!) 84/65, pulse 96, temperature 98.4 F (36.9 C), temperature source Oral, resp. rate 16, height 5\' 2"  (1.575 m), weight 47.2 kg (104 lb), unknown if currently breastfeeding.Body mass index is 19.02 kg/m.  General Appearance: Well Groomed  Eye Contact::  Good  Speech:  Normal Rate409  Volume:  Normal  Mood:  denies feeling depressed, states mood is " normal"  Affect:  Appropriate and Full Range  Thought Process:  Linear and Descriptions of Associations: Intact  Orientation:  Full (Time, Place, and Person)  Thought Content:  denies hallucinations, no delusions, not internally preoccupied   Suicidal Thoughts:  No denies any suicidal or self injurious ideations at this time   Homicidal Thoughts:  No denies any homicidal or violent ideations  Memory:  recent and remote grossly intact   Judgement:  Other:  improved   Insight:  improving   Psychomotor Activity:  no tremors , no diaphoresis, no restlessness   Concentration:  Good  Recall:  Good  Fund of Knowledge:Good  Language:  Good  Akathisia:  Negative  Handed:  Right  AIMS (if indicated):     Assets:  Communication Skills Desire for Improvement Resilience  Sleep:  Number of Hours: 6.75  Cognition: WNL  ADL's:  Intact   Mental Status Per Nursing Assessment::   On Admission:  NA  Demographic Factors:  Married , has a Museum/gallery conservator, currently unemployed   Loss Factors: Stressful job   Historical Factors: No prior psychiatric admissions, no prior suicide attempts, no history of self cutting, history of alcohol use in binges   Risk Reduction Factors:   Responsible for children under 62 years of age, Sense of responsibility to family, Living with another person, especially a relative, Positive social support and Positive coping skills or problem solving skills  Continued Clinical Symptoms:  At this time patient is alert and attentive, well related, calm , mood is described as normal, denies feeling depressed, affect is appropriate, no thought disorder, no suicidal or self injurious ideations, no homicidal or violent ideations, no hallucinations, no delusions expressed, future oriented . States she has quit her job, as she feels this was her major stressor, and plans to look for a new job that is " healthier for me and my family". Not currently on any psychiatric medications, states she plans to discontinue Trazodone ,which she took as PRN for insomnia on unit.   Cognitive Features That Contribute To Risk:  No gross cognitive deficits noted upon discharge. Is alert , attentive, and oriented x 3   Suicide Risk:  Mild:  Suicidal ideation of limited frequency, intensity, duration, and specificity.  There are no identifiable plans, no associated intent, mild  dysphoria and related symptoms, good self-control (both objective and subjective assessment), few other risk factors, and identifiable protective factors, including available and accessible social support.  Follow-up Information    [facility]  Follow up on 09/13/2017.   Why:  Assessment with Pincus Sanesarolyn Rifkin at 9:30AM for therapy. To hold this appt, please stop by the office after discharge to pay $20 deposit and give them your insurance card so that they can make a copy. Thank you.  Contact information: [address] [place] [address] [phone]        Medication management appt not needed/patient is not prescribed psychiatric medication. Follow up.           Plan Of Care/Follow-up recommendations:  Activity:  as tolerated  Diet:  regular  Tests:  NA Other:  See below  Patient is expressing readiness for discharge and is leaving unit in good spirits  Plans to return home, states husband will be picking her up later today Follow up as above   Craige CottaFernando A Cobos, MD 09/07/2017, 11:57 AM

## 2018-08-30 DIAGNOSIS — R51 Headache: Secondary | ICD-10-CM | POA: Diagnosis not present

## 2018-10-19 ENCOUNTER — Telehealth: Payer: Managed Care, Other (non HMO) | Admitting: Family

## 2018-10-19 DIAGNOSIS — J069 Acute upper respiratory infection, unspecified: Secondary | ICD-10-CM

## 2018-10-19 DIAGNOSIS — R6889 Other general symptoms and signs: Secondary | ICD-10-CM

## 2018-10-19 MED ORDER — FLUTICASONE PROPIONATE 50 MCG/ACT NA SUSP: 2.0000 | Freq: Every day | NASAL | 6 refills | Status: AC

## 2018-10-19 MED ORDER — BENZONATATE 100 MG PO CAPS: 100.0000 mg | ORAL_CAPSULE | Freq: Three times a day (TID) | ORAL | 0 refills | Status: AC | PRN

## 2018-10-19 NOTE — Progress Notes (Signed)

## 2018-11-10 DIAGNOSIS — A09 Infectious gastroenteritis and colitis, unspecified: Secondary | ICD-10-CM | POA: Diagnosis not present

## 2018-11-15 ENCOUNTER — Telehealth: Payer: Managed Care, Other (non HMO) | Admitting: Physician Assistant

## 2018-11-15 DIAGNOSIS — R509 Fever, unspecified: Secondary | ICD-10-CM

## 2018-11-15 NOTE — Progress Notes (Signed)
E-Visit for Corona Virus Screening  Based on your current symptoms, you may very well have the virus, however your symptoms are mild. Currently, not all patients are being tested. If the symptoms are mild and there is not a known exposure, performing the test is not indicated.  Coronavirus disease 2019 (COVID-19) is a respiratory illness that can spread from person to person. The virus that causes COVID-19 is a new virus that was first identified in the country of [place] but is now found in multiple other countries and has spread to the [place].  Symptoms associated with the virus are mild to severe fever, cough, and shortness of breath. There is currently no vaccine to protect against COVID-19, and there is no specific antiviral treatment for the virus.   To be considered HIGH RISK for Coronavirus (COVID-19), you have to meet the following criteria:  . Traveled to [place], [place], [place], [place] or [place]; or in the [place] to [place], [place], [place], or [place]; and have fever, cough, and shortness of breath within the last 2 weeks of travel OR  . Been in close contact with a person diagnosed with COVID-19 within the last 2 weeks and have fever, cough, and shortness of breath  . IF YOU DO NOT MEET THESE CRITERIA, YOU ARE CONSIDERED LOW RISK FOR COVID-19.   It is vitally important that if you feel that you have an infection such as this virus or any other virus that you stay home and away from places where you may spread it to others.  You should self-quarantine for 14 days if you have symptoms that could potentially be coronavirus and avoid contact with people age 65 and older.    You may take acetaminophen (Tylenol) as needed for fever.   Reduce your risk of any infection by using the same precautions used for avoiding the common cold or flu:  . Wash your hands often with soap and warm water for at least 20 seconds.  If soap and water are not readily available, use an  alcohol-based hand sanitizer with at least 60% alcohol.  . If coughing or sneezing, cover your mouth and nose by coughing or sneezing into the elbow areas of your shirt or coat, into a tissue or into your sleeve (not your hands). . Avoid shaking hands with others and consider head nods or verbal greetings only. . Avoid touching your eyes, nose, or mouth with unwashed hands.  . Avoid close contact with people who are sick. . Avoid places or events with large numbers of people in one location, like concerts or sporting events. . Carefully consider travel plans you have or are making. . If you are planning any travel outside or inside the [place], visit the CDC's Travelers' Health webpage for the latest health notices. . If you have some symptoms but not all symptoms, continue to monitor at home and seek medical attention if your symptoms worsen. . If you are having a medical emergency, call 911.  HOME CARE . Only take medications as instructed by your medical team. . Drink plenty of fluids and get plenty of rest. . A steam or ultrasonic humidifier can help if you have congestion.   GET HELP RIGHT AWAY IF: . You develop worsening fever. . You become short of breath . You cough up blood. . Your symptoms become more severe MAKE SURE YOU   Understand these instructions.  Will watch your condition.  Will get help right away if   plan could have included both over the counter or prescription medications.If there is a problem please reply once you have received a response from your provider. Your safety is important to [place].If you have drug allergies check your prescription carefully.  You can use MyChart to ask questions about today's visit, request a non-urgent call back, or ask for a work or school excuse for 24 hours related to this  e-Visit. If it has been greater than 24 hours you will need to follow up with your provider, or enter a new e-Visit to address those concerns. You will get an e-mail in the next two days asking about your experience.I hope that your e-visit has been valuable and will speed your recovery. Thank you for using e-visits.     ----- Message -----  From: Belinda Avery  Sent: 11/15/2018 11:44 AM EDT  To: E-Visit Mailing List Subject: E-Visit Submission: CoronaVirus (COVID-19) Screening  E-Visit Submission: CoronaVirus (COVID-19) Screening --------------------------------  Question: Do you have any of the following?  Answer: Cough  Fever  Question: Do you have any of the following additional symptoms?  Answer: Stuffy nose  Body aches  Diarrhea  Vomiting  Question: Have you had a fever? Answer: Yes  Question: If you are running a fever, please type in your temperature reading Answer: 100  Question: How long have you had the fever? Answer: For a few days  Question: Have others in your home or workplace had similar symptoms? Answer: No  Question: When did your symptoms start? Answer: 11/09/2018  Question: Have you recently visited any of the following countries? Answer: None of these  Question: If you have traveled anywhere in the last2 months please document where you have visited: Answer: [place]- [place], [place]  Question: Have you recently been around others from these countries or visited these countries who have had coughing or fever? Answer: No  Question: Have you recently been around anyone who has been diagnosed with Corona virus? Answer: Yes  Question: Have you been taking any medications? Answer: Yes  Question: If taking medications for these symptoms, please list the names and whether they are helping or not Answer: Ciprofloxacin, tums, Sudafed  Question: Are you treated for  any of the following conditions: Asthma, COPD, Diabetes, Renal Failure (on Dialysis), AIDS, any Neuromuscular disease that effects the clearing of secretions, Heart Failure, or Heart Disease? Answer: No  Question: Please enter a phone number where you can be reached if we have additional questions about your symptoms Answer: [phone]  Question: Please list your medication allergies that you may have ? (If 'none' , please list as 'none') Answer: Macrobid  Question: Please list any additional comments  Answer: On 11/10/18 I was prescribed Ciprofloxacin due to symptoms of nausea, diarrhea, abdominal pain and fever. I have developed body aches and severe congestion since this visit to accompany my symptoms that have not gotten better with the Ciprofloxacin  Question: Are you pregnant? Answer: I am confident that I am not pregnant  Question: Are you breastfeeding? Answer: No  A total of 5-10 minutes was spent evaluating this patients questionnaire and formulating a plan of care.

## 2018-11-29 DIAGNOSIS — F4321 Adjustment disorder with depressed mood: Secondary | ICD-10-CM | POA: Diagnosis not present

## 2018-11-29 DIAGNOSIS — F1020 Alcohol dependence, uncomplicated: Secondary | ICD-10-CM | POA: Diagnosis not present

## 2019-01-12 DIAGNOSIS — F1020 Alcohol dependence, uncomplicated: Secondary | ICD-10-CM | POA: Diagnosis not present

## 2019-01-12 DIAGNOSIS — F4321 Adjustment disorder with depressed mood: Secondary | ICD-10-CM | POA: Diagnosis not present

## 2019-04-04 DIAGNOSIS — F4321 Adjustment disorder with depressed mood: Secondary | ICD-10-CM | POA: Diagnosis not present

## 2019-04-04 DIAGNOSIS — F1020 Alcohol dependence, uncomplicated: Secondary | ICD-10-CM | POA: Diagnosis not present

## 2019-04-18 DIAGNOSIS — M25561 Pain in right knee: Secondary | ICD-10-CM | POA: Diagnosis not present

## 2019-04-18 DIAGNOSIS — R51 Headache: Secondary | ICD-10-CM | POA: Diagnosis not present

## 2019-04-21 ENCOUNTER — Other Ambulatory Visit: Payer: Self-pay | Admitting: Family Medicine

## 2019-04-21 DIAGNOSIS — R519 Headache, unspecified: Secondary | ICD-10-CM

## 2019-04-21 DIAGNOSIS — M25561 Pain in right knee: Secondary | ICD-10-CM

## 2019-04-27 ENCOUNTER — Ambulatory Visit
Admission: RE | Admit: 2019-04-27 | Discharge: 2019-04-27 | Disposition: A | Discharge status: Home / Self Care | Payer: BC Managed Care – PPO | Source: Ambulatory Visit | Attending: Family Medicine | Admitting: Family Medicine

## 2019-04-27 DIAGNOSIS — M25561 Pain in right knee: Secondary | ICD-10-CM

## 2019-04-27 IMAGING — US US EXTREM LOW*R* LIMITED
1 series · 12 of 12 positions shown · non-contrast
Comparison: None

CLINICAL DATA: 28-year-old female with knee pain

EXAM:
ULTRASOUND RIGHT LOWER EXTREMITY LIMITED
TECHNIQUE: Ultrasound examination of the lower extremity soft tissues was
performed in the area of clinical concern.

[Series 1: us extrem low*right* limited · 0.07mm/px · 12 acquisitions, 12 frames shown]
[im 1/12]
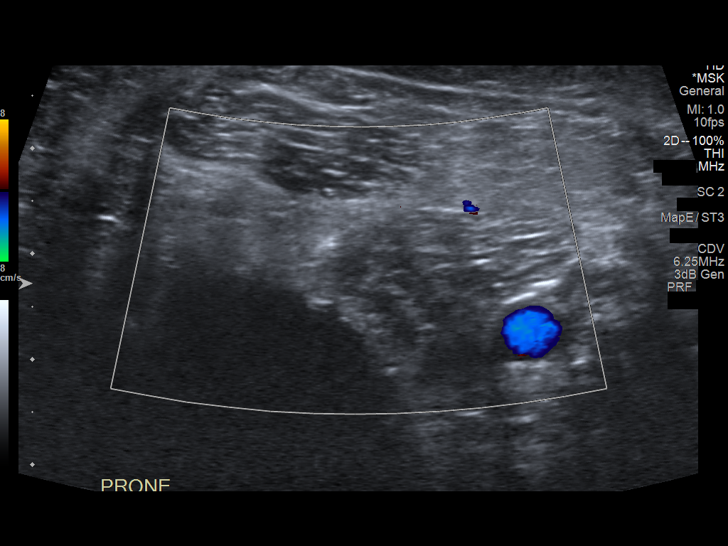
[im 2/12]
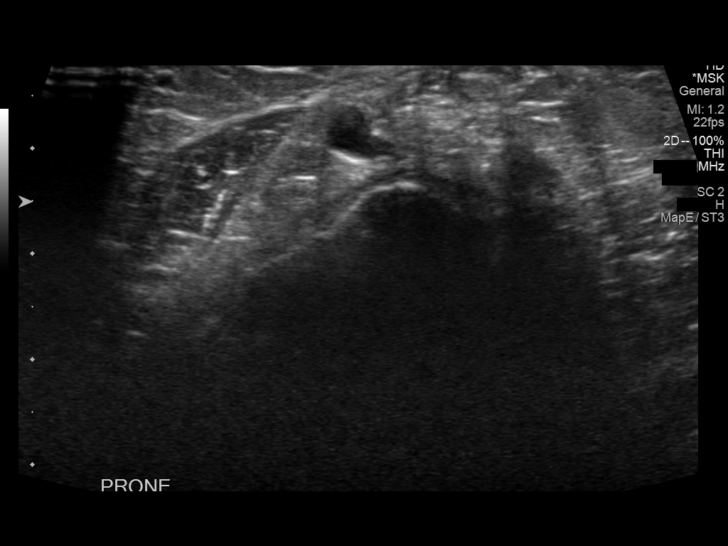
[im 3/12]
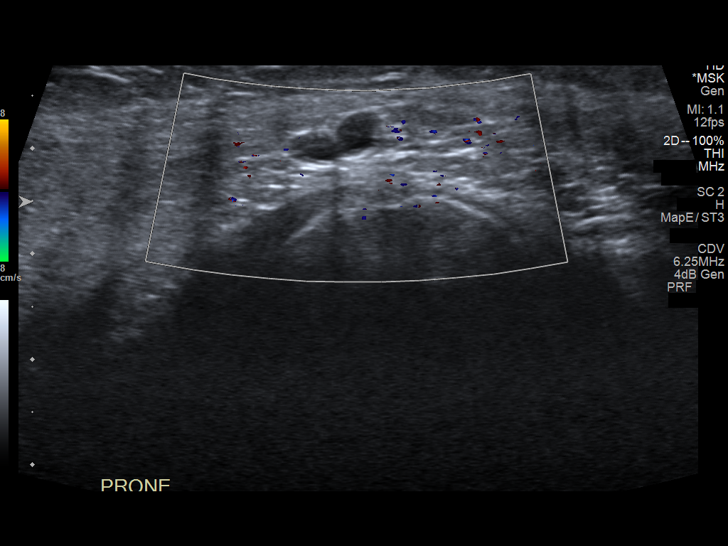
[im 4/12]
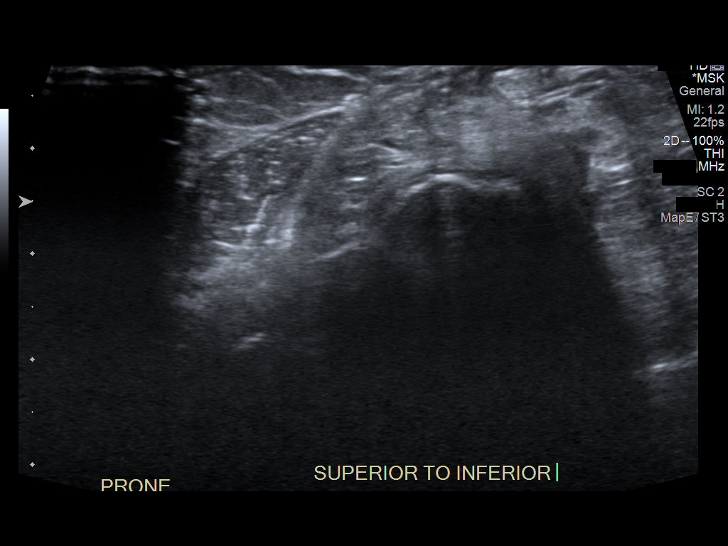
[im 5/12]
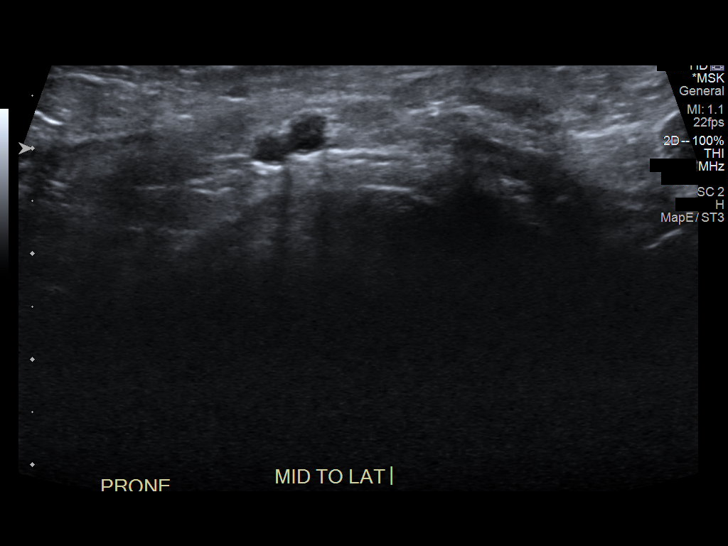
[im 6/12]
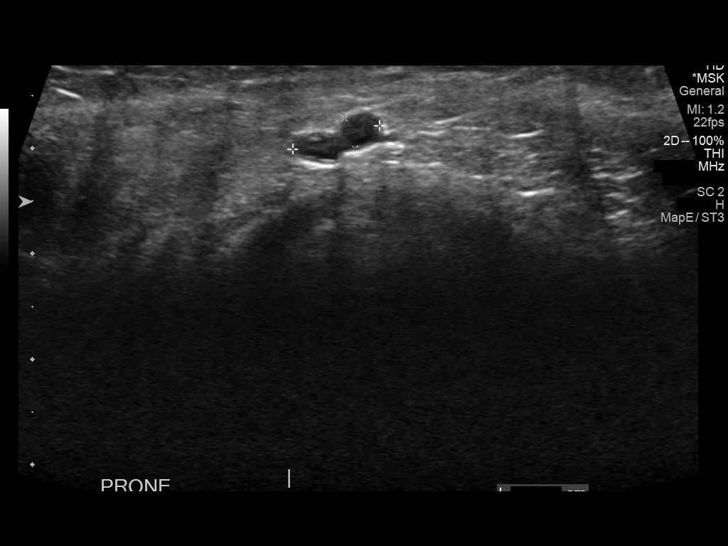
[im 7/12]
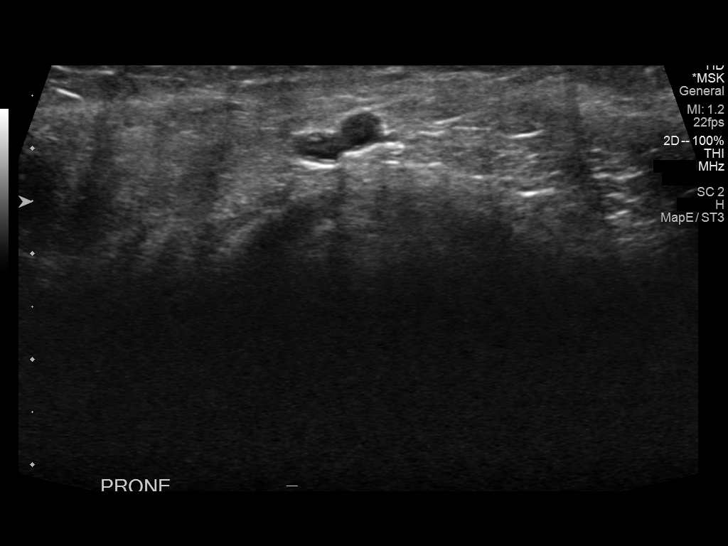
[im 8/12]
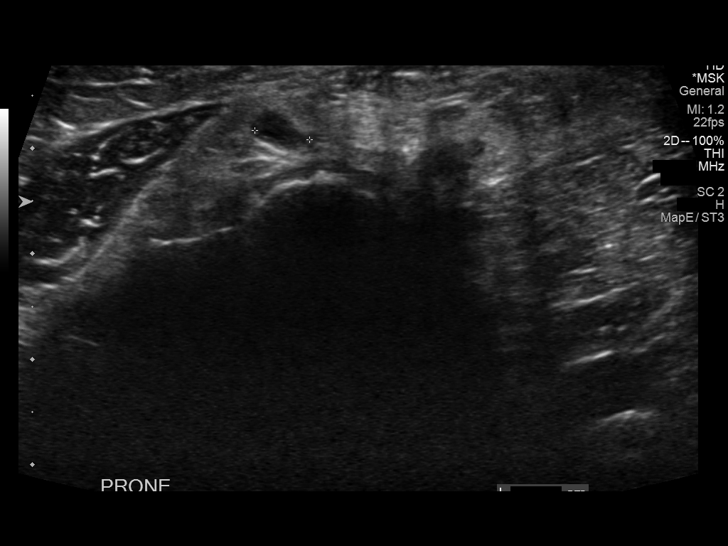
[im 9/12]
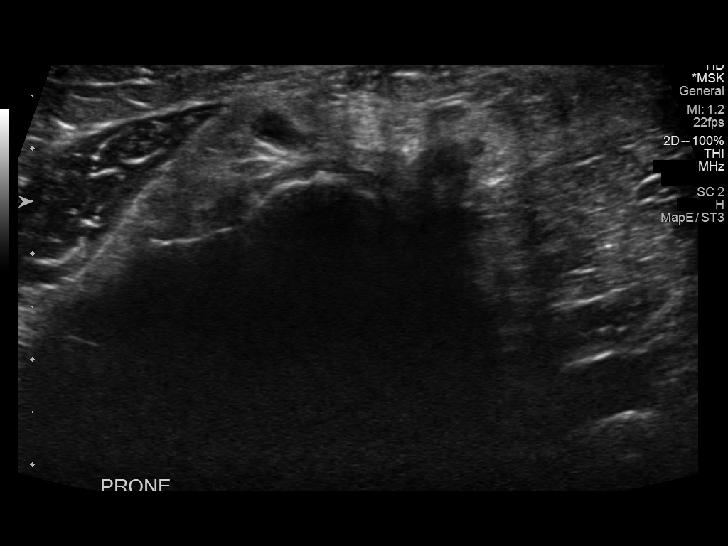
[im 10/12]
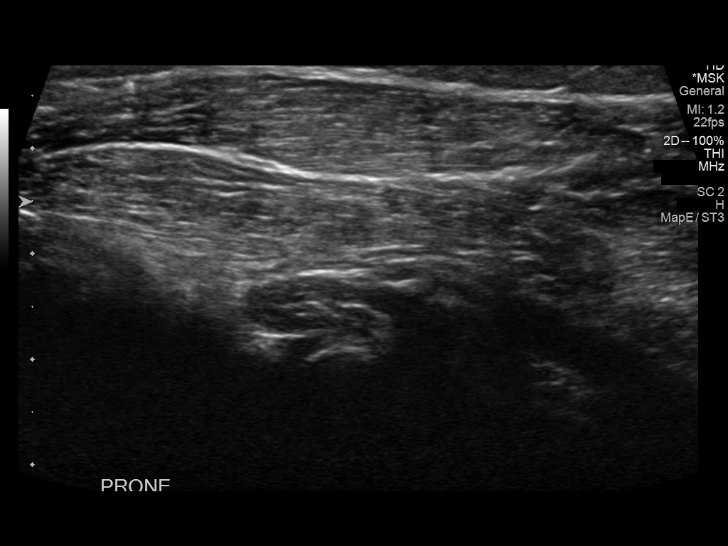
[im 11/12]
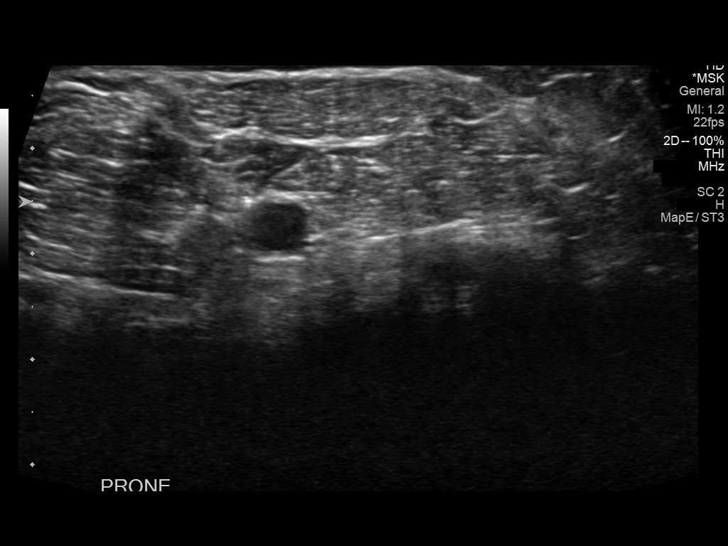
[im 12/12]
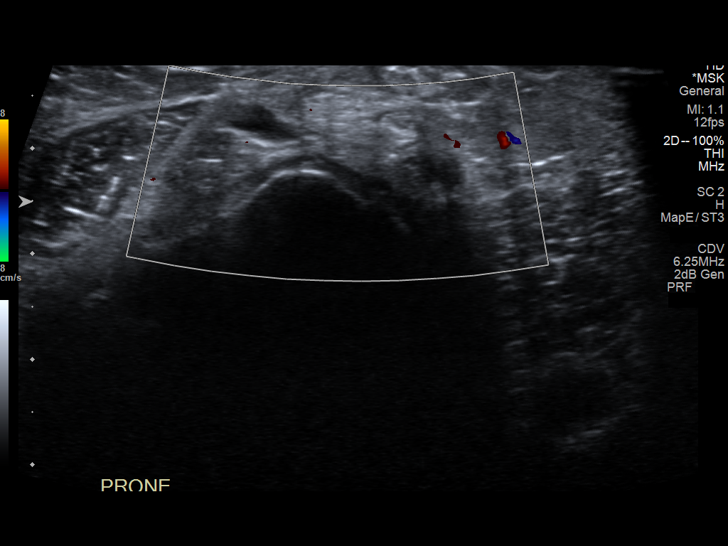

[12 of 12 positions shown; findings below may reference images not displayed]

FINDINGS: Grayscale and color duplex in the region of concern.

Hypoechoic/anechoic focus with through transmission within the
medial knee soft tissues measuring 9 mm x 3 mm x 5 mm. No evidence
that this connects to the joint space on the given images.
IMPRESSION: Small, 9 mm nonspecific hypoechoic focus within the medial soft
tissues of the right knee. While it appears cystic, there is no
evidence that the structure connects to the joint space, which would
confirm a parameniscal cyst or Baker's cyst. Further evaluation with
MR YERALDIN be considered.

## 2019-05-02 ENCOUNTER — Ambulatory Visit
Admission: RE | Admit: 2019-05-02 | Discharge: 2019-05-02 | Disposition: A | Discharge status: Home / Self Care | Payer: BC Managed Care – PPO | Source: Ambulatory Visit | Attending: Family Medicine | Admitting: Family Medicine

## 2019-05-02 DIAGNOSIS — R51 Headache: Secondary | ICD-10-CM | POA: Diagnosis not present

## 2019-05-02 DIAGNOSIS — R519 Headache, unspecified: Secondary | ICD-10-CM

## 2019-05-02 MED ORDER — IOPAMIDOL (ISOVUE-300) INJECTION 61%
75.0000 mL | Freq: Once | INTRAVENOUS | Status: AC | PRN
  Administered 2019-05-02: 75 mL via INTRAVENOUS

## 2019-05-15 DIAGNOSIS — M25561 Pain in right knee: Secondary | ICD-10-CM | POA: Diagnosis not present

## 2019-05-15 DIAGNOSIS — M25562 Pain in left knee: Secondary | ICD-10-CM | POA: Diagnosis not present

## 2019-07-03 DIAGNOSIS — F4321 Adjustment disorder with depressed mood: Secondary | ICD-10-CM | POA: Diagnosis not present

## 2019-07-03 DIAGNOSIS — F1020 Alcohol dependence, uncomplicated: Secondary | ICD-10-CM | POA: Diagnosis not present

## 2019-07-17 DIAGNOSIS — Z0000 Encounter for general adult medical examination without abnormal findings: Secondary | ICD-10-CM | POA: Diagnosis not present

## 2019-07-20 DIAGNOSIS — J019 Acute sinusitis, unspecified: Secondary | ICD-10-CM | POA: Diagnosis not present

## 2019-07-20 DIAGNOSIS — R05 Cough: Secondary | ICD-10-CM | POA: Diagnosis not present

## 2019-07-20 DIAGNOSIS — Z03818 Encounter for observation for suspected exposure to other biological agents ruled out: Secondary | ICD-10-CM | POA: Diagnosis not present

## 2019-08-14 DIAGNOSIS — F1020 Alcohol dependence, uncomplicated: Secondary | ICD-10-CM | POA: Diagnosis not present

## 2019-08-14 DIAGNOSIS — F4321 Adjustment disorder with depressed mood: Secondary | ICD-10-CM | POA: Diagnosis not present

## 2019-09-15 DIAGNOSIS — N760 Acute vaginitis: Secondary | ICD-10-CM | POA: Diagnosis not present

## 2019-11-13 DIAGNOSIS — Z319 Encounter for procreative management, unspecified: Secondary | ICD-10-CM | POA: Diagnosis not present

## 2019-11-13 DIAGNOSIS — R319 Hematuria, unspecified: Secondary | ICD-10-CM | POA: Diagnosis not present

## 2019-11-13 DIAGNOSIS — Z6821 Body mass index (BMI) 21.0-21.9, adult: Secondary | ICD-10-CM | POA: Diagnosis not present

## 2019-11-13 DIAGNOSIS — A609 Anogenital herpesviral infection, unspecified: Secondary | ICD-10-CM | POA: Diagnosis not present

## 2019-11-13 DIAGNOSIS — Z30432 Encounter for removal of intrauterine contraceptive device: Secondary | ICD-10-CM | POA: Diagnosis not present

## 2019-11-13 DIAGNOSIS — N029 Recurrent and persistent hematuria with unspecified morphologic changes: Secondary | ICD-10-CM | POA: Diagnosis not present

## 2019-11-13 DIAGNOSIS — Z01419 Encounter for gynecological examination (general) (routine) without abnormal findings: Secondary | ICD-10-CM | POA: Diagnosis not present

## 2020-02-27 DIAGNOSIS — F1011 Alcohol abuse, in remission: Secondary | ICD-10-CM | POA: Diagnosis not present

## 2020-02-27 DIAGNOSIS — F339 Major depressive disorder, recurrent, unspecified: Secondary | ICD-10-CM | POA: Diagnosis not present

## 2020-05-21 DIAGNOSIS — F339 Major depressive disorder, recurrent, unspecified: Secondary | ICD-10-CM | POA: Diagnosis not present

## 2020-05-21 DIAGNOSIS — F1011 Alcohol abuse, in remission: Secondary | ICD-10-CM | POA: Diagnosis not present

## 2020-08-22 DIAGNOSIS — R000 Tachycardia, unspecified: Secondary | ICD-10-CM | POA: Diagnosis not present

## 2020-08-22 DIAGNOSIS — F32A Depression, unspecified: Secondary | ICD-10-CM | POA: Diagnosis not present

## 2020-08-22 DIAGNOSIS — Z3A01 Less than 8 weeks gestation of pregnancy: Secondary | ICD-10-CM | POA: Diagnosis not present

## 2020-08-22 DIAGNOSIS — F4320 Adjustment disorder, unspecified: Secondary | ICD-10-CM | POA: Diagnosis not present

## 2020-08-22 DIAGNOSIS — Z3689 Encounter for other specified antenatal screening: Secondary | ICD-10-CM | POA: Diagnosis not present

## 2020-08-22 DIAGNOSIS — O10011 Pre-existing essential hypertension complicating pregnancy, first trimester: Secondary | ICD-10-CM | POA: Diagnosis not present

## 2020-08-22 DIAGNOSIS — O99341 Other mental disorders complicating pregnancy, first trimester: Secondary | ICD-10-CM | POA: Diagnosis not present

## 2020-08-22 DIAGNOSIS — O3680X0 Pregnancy with inconclusive fetal viability, not applicable or unspecified: Secondary | ICD-10-CM | POA: Diagnosis not present

## 2020-08-22 DIAGNOSIS — O99891 Other specified diseases and conditions complicating pregnancy: Secondary | ICD-10-CM | POA: Diagnosis not present

## 2020-09-20 DIAGNOSIS — Z8759 Personal history of other complications of pregnancy, childbirth and the puerperium: Secondary | ICD-10-CM | POA: Diagnosis not present

## 2020-09-20 DIAGNOSIS — F4320 Adjustment disorder, unspecified: Secondary | ICD-10-CM | POA: Diagnosis not present

## 2020-09-20 DIAGNOSIS — O3680X0 Pregnancy with inconclusive fetal viability, not applicable or unspecified: Secondary | ICD-10-CM | POA: Diagnosis not present

## 2020-09-20 DIAGNOSIS — Z3689 Encounter for other specified antenatal screening: Secondary | ICD-10-CM | POA: Diagnosis not present

## 2020-09-20 DIAGNOSIS — F322 Major depressive disorder, single episode, severe without psychotic features: Secondary | ICD-10-CM | POA: Diagnosis not present

## 2020-09-20 DIAGNOSIS — O10011 Pre-existing essential hypertension complicating pregnancy, first trimester: Secondary | ICD-10-CM | POA: Diagnosis not present

## 2020-09-20 DIAGNOSIS — A6000 Herpesviral infection of urogenital system, unspecified: Secondary | ICD-10-CM | POA: Diagnosis not present

## 2020-09-20 DIAGNOSIS — O98311 Other infections with a predominantly sexual mode of transmission complicating pregnancy, first trimester: Secondary | ICD-10-CM | POA: Diagnosis not present

## 2020-09-20 DIAGNOSIS — R000 Tachycardia, unspecified: Secondary | ICD-10-CM | POA: Diagnosis not present

## 2020-09-20 DIAGNOSIS — O99341 Other mental disorders complicating pregnancy, first trimester: Secondary | ICD-10-CM | POA: Diagnosis not present

## 2020-09-20 DIAGNOSIS — O99891 Other specified diseases and conditions complicating pregnancy: Secondary | ICD-10-CM | POA: Diagnosis not present

## 2020-09-20 DIAGNOSIS — Z3A10 10 weeks gestation of pregnancy: Secondary | ICD-10-CM | POA: Diagnosis not present

## 2020-09-20 DIAGNOSIS — F419 Anxiety disorder, unspecified: Secondary | ICD-10-CM | POA: Diagnosis not present
